# Patient Record
Sex: Male | Born: 1960 | Hispanic: Yes | Marital: Married | Smoking: Former smoker
Health system: Southern US, Community
[De-identification: ages and names within clinical notes are randomized; demographics above are authoritative.]

## PROBLEM LIST (undated history)

## (undated) DIAGNOSIS — I10 Essential (primary) hypertension: Secondary | ICD-10-CM

## (undated) DIAGNOSIS — K219 Gastro-esophageal reflux disease without esophagitis: Secondary | ICD-10-CM

---

## 2017-12-28 ENCOUNTER — Observation Stay (HOSPITAL_COMMUNITY)
Admission: EM | Admit: 2017-12-28 | Discharge: 2017-12-30 | Disposition: A | Discharge status: Home / Self Care | Payer: Self-pay | Attending: Internal Medicine | Admitting: Internal Medicine

## 2017-12-28 ENCOUNTER — Emergency Department (HOSPITAL_COMMUNITY): Payer: Self-pay

## 2017-12-28 ENCOUNTER — Encounter (HOSPITAL_COMMUNITY): Payer: Self-pay | Admitting: Emergency Medicine

## 2017-12-28 DIAGNOSIS — R0600 Dyspnea, unspecified: Secondary | ICD-10-CM | POA: Insufficient documentation

## 2017-12-28 DIAGNOSIS — R110 Nausea: Secondary | ICD-10-CM

## 2017-12-28 DIAGNOSIS — I161 Hypertensive emergency: Secondary | ICD-10-CM | POA: Insufficient documentation

## 2017-12-28 DIAGNOSIS — R072 Precordial pain: Secondary | ICD-10-CM

## 2017-12-28 DIAGNOSIS — I10 Essential (primary) hypertension: Secondary | ICD-10-CM | POA: Insufficient documentation

## 2017-12-28 DIAGNOSIS — Z87891 Personal history of nicotine dependence: Secondary | ICD-10-CM | POA: Insufficient documentation

## 2017-12-28 DIAGNOSIS — R030 Elevated blood-pressure reading, without diagnosis of hypertension: Secondary | ICD-10-CM

## 2017-12-28 DIAGNOSIS — R079 Chest pain, unspecified: Principal | ICD-10-CM | POA: Insufficient documentation

## 2017-12-28 DIAGNOSIS — R066 Hiccough: Secondary | ICD-10-CM | POA: Insufficient documentation

## 2017-12-28 HISTORY — DX: Gastro-esophageal reflux disease without esophagitis: K21.9

## 2017-12-28 HISTORY — DX: Essential (primary) hypertension: I10

## 2017-12-28 LAB — COMPREHENSIVE METABOLIC PANEL
ALK PHOS: 64 U/L (ref 38–126)
ALT: 29 U/L (ref 0–44)
ANION GAP: 11 (ref 5–15)
AST: 47 U/L — ABNORMAL HIGH (ref 15–41)
Albumin: 4.3 g/dL (ref 3.5–5.0)
BILIRUBIN TOTAL: 1 mg/dL (ref 0.3–1.2)
BUN: 6 mg/dL (ref 6–20)
CALCIUM: 8.7 mg/dL — AB (ref 8.9–10.3)
CO2: 23 mmol/L (ref 22–32)
Chloride: 102 mmol/L (ref 98–111)
Creatinine, Ser: 0.71 mg/dL (ref 0.61–1.24)
Glucose, Bld: 115 mg/dL — ABNORMAL HIGH (ref 70–99)
Potassium: 3.5 mmol/L (ref 3.5–5.1)
SODIUM: 136 mmol/L (ref 135–145)
TOTAL PROTEIN: 7.9 g/dL (ref 6.5–8.1)

## 2017-12-28 LAB — URINALYSIS, ROUTINE W REFLEX MICROSCOPIC
Bilirubin Urine: NEGATIVE
GLUCOSE, UA: 50 mg/dL — AB
HGB URINE DIPSTICK: NEGATIVE
Ketones, ur: NEGATIVE mg/dL
Leukocytes, UA: NEGATIVE
Nitrite: NEGATIVE
PH: 6 (ref 5.0–8.0)
Protein, ur: NEGATIVE mg/dL
SPECIFIC GRAVITY, URINE: 1.005 (ref 1.005–1.030)

## 2017-12-28 LAB — CBC WITH DIFFERENTIAL/PLATELET
Basophils Absolute: 0.1 10*3/uL (ref 0.0–0.1)
Basophils Relative: 1 %
EOS ABS: 0 10*3/uL (ref 0.0–0.7)
Eosinophils Relative: 0 %
HCT: 38.9 % — ABNORMAL LOW (ref 39.0–52.0)
HEMOGLOBIN: 14 g/dL (ref 13.0–17.0)
LYMPHS ABS: 0.9 10*3/uL (ref 0.7–4.0)
Lymphocytes Relative: 13 %
MCH: 33.4 pg (ref 26.0–34.0)
MCHC: 36 g/dL (ref 30.0–36.0)
MCV: 92.8 fL (ref 78.0–100.0)
MONOS PCT: 7 %
Monocytes Absolute: 0.5 10*3/uL (ref 0.1–1.0)
NEUTROS PCT: 79 %
Neutro Abs: 5.4 10*3/uL (ref 1.7–7.7)
Platelets: 242 10*3/uL (ref 150–400)
RBC: 4.19 MIL/uL — ABNORMAL LOW (ref 4.22–5.81)
RDW: 13.9 % (ref 11.5–15.5)
WBC: 6.8 10*3/uL (ref 4.0–10.5)

## 2017-12-28 LAB — I-STAT TROPONIN, ED
TROPONIN I, POC: 0 ng/mL (ref 0.00–0.08)
Troponin i, poc: 0 ng/mL (ref 0.00–0.08)

## 2017-12-28 LAB — RAPID URINE DRUG SCREEN, HOSP PERFORMED
Amphetamines: NOT DETECTED
Benzodiazepines: NOT DETECTED
Cocaine: NOT DETECTED
Opiates: POSITIVE — AB
TETRAHYDROCANNABINOL: NOT DETECTED

## 2017-12-28 LAB — LIPASE, BLOOD: LIPASE: 34 U/L (ref 11–51)

## 2017-12-28 LAB — I-STAT CG4 LACTIC ACID, ED
LACTIC ACID, VENOUS: 3.43 mmol/L — AB (ref 0.5–1.9)
Lactic Acid, Venous: 1.61 mmol/L (ref 0.5–1.9)

## 2017-12-28 MED ORDER — LABETALOL HCL 5 MG/ML IV SOLN
10.0000 mg | Freq: Once | INTRAVENOUS | Status: AC
  Administered 2017-12-28: 10 mg via INTRAVENOUS
  Filled 2017-12-28: qty 4

## 2017-12-28 MED ORDER — IOPAMIDOL (ISOVUE-370) INJECTION 76%
INTRAVENOUS | Status: AC
  Filled 2017-12-28: qty 100

## 2017-12-28 MED ORDER — MORPHINE SULFATE (PF) 4 MG/ML IV SOLN
4.0000 mg | Freq: Once | INTRAVENOUS | Status: AC
  Administered 2017-12-28: 4 mg via INTRAVENOUS
  Filled 2017-12-28: qty 1

## 2017-12-28 MED ORDER — IOPAMIDOL (ISOVUE-370) INJECTION 76%
100.0000 mL | Freq: Once | INTRAVENOUS | Status: AC | PRN
  Administered 2017-12-28: 100 mL via INTRAVENOUS

## 2017-12-28 MED ORDER — ONDANSETRON HCL 4 MG/2ML IJ SOLN
4.0000 mg | Freq: Once | INTRAMUSCULAR | Status: AC
  Administered 2017-12-28: 4 mg via INTRAVENOUS
  Filled 2017-12-28: qty 2

## 2017-12-28 NOTE — ED Provider Notes (Signed)
[facility]-EMERGENCY DEPT Provider Note   CSN: [id number] Arrival date & time: 12/28/17  1546     History   Chief Complaint Chief Complaint  Patient presents with  . Hypertension  . Hiccups    HPI Christopher Boyd is a 57 y.o. male.  The history is provided by the patient and medical records. No language interpreter was used.  Chest Pain   This is a new problem. The current episode started yesterday. The problem occurs constantly. The problem has been gradually worsening. The pain is present in the substernal region. The pain is at a severity of 10/10. The pain is severe. The quality of the pain is described as heavy, pressure-like and sharp. The pain does not radiate. Associated symptoms include cough, nausea and shortness of breath. Pertinent negatives include no abdominal pain, no back pain, no diaphoresis, no fever, no numbness, no palpitations and no vomiting. He has tried nothing for the symptoms. The treatment provided no relief. Risk factors include male gender.    History reviewed. No pertinent past medical history.  There are no active problems to display for this patient.   History reviewed. No pertinent surgical history.      Home Medications    Prior to Admission medications   Not on File    Family History No family history on file.  Social History Social History   Tobacco Use  . Smoking status: Not on file  Substance Use Topics  . Alcohol use: Not on file  . Drug use: Not on file     Allergies   Patient has no known allergies.   Review of Systems Review of Systems  Constitutional: Positive for chills. Negative for diaphoresis, fatigue and fever.  HENT: Negative for congestion.   Eyes: Negative for visual disturbance.  Respiratory: Positive for cough and shortness of breath. Negative for choking, chest tightness and wheezing.   Cardiovascular: Positive for chest pain. Negative for palpitations.  Gastrointestinal:  Positive for nausea. Negative for abdominal distention, abdominal pain, constipation, diarrhea and vomiting.  Genitourinary: Negative for flank pain and frequency.  Musculoskeletal: Negative for back pain, neck pain and neck stiffness.  Skin: Negative for rash and wound.  Neurological: Negative for light-headedness and numbness.  Psychiatric/Behavioral: Negative for agitation and confusion.  All other systems reviewed and are negative.    Physical Exam Updated Vital Signs BP (!) 191/115 (BP Location: Right Arm)   Pulse 94   Temp 98 F (36.7 C) (Oral)   Resp (!) 21   SpO2 99%   Physical Exam  Constitutional: He is oriented to person, place, and time. He appears well-developed and well-nourished. No distress.  HENT:  Head: Normocephalic.  Nose: Nose normal.  Mouth/Throat: Oropharynx is clear and moist. No oropharyngeal exudate.  Eyes: Pupils are equal, round, and reactive to light. Conjunctivae and EOM are normal.  Neck: Normal range of motion. Neck supple.  Cardiovascular: Intact distal pulses. Tachycardia present.  No murmur heard. Pulmonary/Chest: No stridor. Tachypnea noted. No respiratory distress. He has no wheezes. He has no rales. He exhibits no tenderness.  Abdominal: Soft. Normal appearance and bowel sounds are normal. He exhibits no distension. There is tenderness in the epigastric area and periumbilical area.    Musculoskeletal: He exhibits no edema or tenderness.  Neurological: He is alert and oriented to person, place, and time. No sensory deficit. He exhibits normal muscle tone.  Skin: Capillary refill takes less than 2 seconds. He is not diaphoretic. No erythema. No pallor.  Nursing note and vitals reviewed.    ED Treatments / Results  Labs (all labs ordered are listed, but only abnormal results are displayed) Labs Reviewed  CBC WITH DIFFERENTIAL/PLATELET - Abnormal; Notable for the following components:      Result Value   RBC 4.19 (*)    HCT 38.9 (*)     All other components within normal limits  COMPREHENSIVE METABOLIC PANEL - Abnormal; Notable for the following components:   Glucose, Bld 115 (*)    Calcium 8.7 (*)    AST 47 (*)    All other components within normal limits  URINALYSIS, ROUTINE W REFLEX MICROSCOPIC - Abnormal; Notable for the following components:   Color, Urine STRAW (*)    Glucose, UA 50 (*)    All other components within normal limits  RAPID URINE DRUG SCREEN, HOSP PERFORMED - Abnormal; Notable for the following components:   Opiates POSITIVE (*)    Barbiturates   (*)    Value: Result not available. Reagent lot number recalled by manufacturer.   All other components within normal limits  I-STAT CG4 LACTIC ACID, ED - Abnormal; Notable for the following components:   Lactic Acid, Venous 3.43 (*)    All other components within normal limits  LIPASE, BLOOD  HIV ANTIBODY (ROUTINE TESTING)  COMPREHENSIVE METABOLIC PANEL  CBC  HEMOGLOBIN A1C  LIPID PANEL  TROPONIN I  TROPONIN I  TROPONIN I  HEPATITIS PANEL, ACUTE  I-STAT TROPONIN, ED  I-STAT CG4 LACTIC ACID, ED  I-STAT TROPONIN, ED    EKG EKG Interpretation  Date/Time:  Monday December 28 2017 15:59:25 EDT Ventricular Rate:  94 PR Interval:    QRS Duration: 103 QT Interval:  336 QTC Calculation: 421 R Axis:   -23 Text Interpretation:  Sinus rhythm Left ventricular hypertrophy No prior ECG for comparison.  No STEMI Confirmed by Theda Belfast ([id number]) on 12/28/2017 5:03:07 PM   Radiology Dg Chest 2 View  Result Date: 12/28/2017 CLINICAL DATA:  Shortness of breath. EXAM: CHEST - 2 VIEW COMPARISON:  None. FINDINGS: Mild cardiomegaly. Normal pulmonary vascularity. Mild patchy opacity in the right lower lobe. No focal consolidation, pleural effusion, or pneumothorax. No acute osseous abnormality. IMPRESSION: 1. Mild patchy opacities in the right lower lobe could reflect atelectasis or developing infiltrate. Electronically Signed   By: Obie Dredge M.D.   On:  12/28/2017 17:44   Ct Head Wo Contrast  Result Date: 12/28/2017 CLINICAL DATA:  Headache with elevated blood pressure and hiccups. EXAM: CT HEAD WITHOUT CONTRAST TECHNIQUE: Contiguous axial images were obtained from the base of the skull through the vertex without intravenous contrast. COMPARISON:  None. FINDINGS: Brain: The brainstem, cerebellum, cerebral peduncles, thalami, basal ganglia, basilar cisterns, and ventricular system appear within normal limits. No intracranial hemorrhage, mass lesion, or acute CVA. Vascular: Unremarkable Skull: Unremarkable Sinuses/Orbits: Suspected old left medial orbital wall fracture. Mild chronic ethmoid sinusitis. Other: No supplemental non-categorized findings. IMPRESSION: 1. No significant intracranial abnormality to account for the patient's headache. 2. Mild chronic ethmoid sinusitis. Electronically Signed   By: Gaylyn Rong M.D.   On: 12/28/2017 19:53   Ct Angio Chest/abd/pel For Dissection W And/or Wo Contrast  Result Date: 12/28/2017 CLINICAL DATA:  Acute 10/10 chest and abdominal pain. Shortness of breath. Hiccuping. Hypertensive. EXAM: CT ANGIOGRAPHY CHEST, ABDOMEN AND PELVIS TECHNIQUE: Multidetector CT imaging through the chest, abdomen and pelvis was performed using the standard protocol during bolus administration of intravenous contrast. Multiplanar reconstructed images and MIPs were obtained and reviewed to  evaluate the vascular anatomy. CONTRAST:  ISOVUE-370 IOPAMIDOL (ISOVUE-370) INJECTION 76% COMPARISON:  None. FINDINGS: CTA CHEST FINDINGS CARDIOVASCULAR: Thoracic aorta is normal course and caliber. Two vessel arch is a normal variant. No intrinsic density on noncontrast CT. Homogeneous contrast opacification of thoracic aorta without dissection, aneurysm, luminal irregularity, periaortic fluid collections, or contrast extravasation. Heart size is mildly enlarged. No pericardial effusion. MEDIASTINUM/NODES: No mediastinal mass or  lymphadenopathy by CT size criteria. LUNGS/PLEURA: Tracheobronchial tree is patent, no pneumothorax. No pleural effusions, focal consolidations, pulmonary nodules or masses. Minimal dependent atelectasis. MUSCULOSKELETAL: Non-suspicious. Review of the MIP images confirms the above findings. CTA ABDOMEN AND PELVIS FINDINGS VASCULAR Aorta: Abdominal aorta is normal course and caliber. Homogeneous contrast opacification of aortoiliac vessels without dissection, aneurysm, luminal irregularity, periaortic fluid collections, or contrast extravasation. Mild calcific atherosclerosis. Celiac: Patent. SMA: Patent. Renals: Patent. IMA: Patent. Inflow: Negative. Veins: Negative, not tailored for evaluation. Review of the MIP images confirms the above findings. NON-VASCULAR HEPATOBILIARY: Liver and gallbladder are normal. PANCREAS: Normal. SPLEEN: Normal. ADRENALS/URINARY TRACT: Kidneys are orthotopic, demonstrating symmetric enhancement. No nephrolithiasis, hydronephrosis or solid renal masses. The unopacified ureters are normal in course and caliber. Urinary bladder is adequately distended and unremarkable. Normal adrenal glands. STOMACH/BOWEL: Very small hiatal hernia. The stomach, small and large bowel are normal in course and caliber without inflammatory changes. Mild sigmoid colonic diverticulosis. Normal appendix. VASCULAR/LYMPHATIC: No lymphadenopathy by CT size criteria. REPRODUCTIVE: Normal. OTHER: No intraperitoneal free fluid or free air. Small fat containing LEFT inguinal hernia. MUSCULOSKELETAL: Nonacute. Dependent subcutaneous edema. From severe L5-S1 disc height loss with vacuum disc consistent with degenerative discs, moderate at L3-4 and L4-5. Severe L5-S1 moderate to severe L4-5 neural foraminal narrowing. Osteopenia. Review of the MIP images confirms the above findings. IMPRESSION: CTA CHEST: 1. No acute vascular process. 2. Mild cardiomegaly.  No acute pulmonary process. CTA ABDOMEN AND PELVIS: 1. No acute  vascular process or acute intra-abdominal/pelvic disease. 2. Osteopenia. Aortic Atherosclerosis (ICD10-I70.0). Electronically Signed   By: Awilda Metro M.D.   On: 12/28/2017 19:58    Procedures Procedures (including critical care time)  CRITICAL CARE Performed by: Canary Brim Tegeler Total critical care time: 35 minutes Critical care time was exclusive of separately billable procedures and treating other patients. Hypertensive emergency with multiple doses of IV blood pressure medication requiring admission. Critical care was necessary to treat or prevent imminent or life-threatening deterioration. Critical care was time spent personally by me on the following activities: development of treatment plan with patient and/or surrogate as well as nursing, discussions with consultants, evaluation of patient's response to treatment, examination of patient, obtaining history from patient or surrogate, ordering and performing treatments and interventions, ordering and review of laboratory studies, ordering and review of radiographic studies, pulse oximetry and re-evaluation of patient's condition.   Medications Ordered in ED Medications  iopamidol (ISOVUE-370) 76 % injection (has no administration in time range)  enoxaparin (LOVENOX) injection 40 mg (has no administration in time range)  sodium chloride flush (NS) 0.9 % injection 3 mL (has no administration in time range)  sodium chloride flush (NS) 0.9 % injection 3 mL (has no administration in time range)  0.9 %  sodium chloride infusion (has no administration in time range)  acetaminophen (TYLENOL) tablet 650 mg (has no administration in time range)    Or  acetaminophen (TYLENOL) suppository 650 mg (has no administration in time range)  nitroGLYCERIN (NITROGLYN) 2 % ointment 1 inch (has no administration in time range)  atorvastatin (LIPITOR) tablet 80  mg (has no administration in time range)  losartan (COZAAR) tablet 25 mg (has no  administration in time range)  hydrALAZINE (APRESOLINE) injection 10 mg (has no administration in time range)  carvedilol (COREG) tablet 3.125 mg (has no administration in time range)  aspirin EC tablet 325 mg (has no administration in time range)  morphine 4 MG/ML injection 4 mg (4 mg Intravenous Given 12/28/17 1757)  ondansetron (ZOFRAN) injection 4 mg (4 mg Intravenous Given 12/28/17 1756)  labetalol (NORMODYNE,TRANDATE) injection 10 mg (10 mg Intravenous Given 12/28/17 1758)  iopamidol (ISOVUE-370) 76 % injection 100 mL (100 mLs Intravenous Contrast Given 12/28/17 1927)  labetalol (NORMODYNE,TRANDATE) injection 10 mg (10 mg Intravenous Given 12/28/17 2353)     Initial Impression / Assessment and Plan / ED Course  I have reviewed the triage vital signs and the nursing notes.  Pertinent labs & imaging results that were available during my care of the patient were reviewed by me and considered in my medical decision making (see chart for details).     Christopher Boyd is a 57 y.o. male with no reported past medical history who presents for chest pain, abdominal pain, shortness of breath, and hiccuping.  Interpreter services were used for all conversations.  Patient reports that yesterday while at work on break he started having sudden onset of pain.  He describes it as a pressure and tightness in his central chest radiating into his abdomen and into his back.  He says that his blood pressure is high but he  has never been on medications for them.  He denies any family history of cardiac disease and does not smoke.  He reports the pain has been a 10 out of 10.  He also reports severe hiccups.  He denies recent trauma but does say back in May he fell off a scooter that denied subsequent injuries.  He reports nausea but no vomiting.  He does report diaphoresis with it.  He reports shortness of breath and chest tightness.  He denies any syncope urinary symptoms or other GI symptoms.  He reports a mild  cough.  On arrival, patient was found to be tachypneic and hypertensive with a blood pressure in the 190s systolic.  Patient appears uncomfortable and is hiccuping and grimacing in chest and abdominal pain.  Patient's abdomen was tender to palpation in the central abdomen.  Lower chest was mildly tender to palpation.  Lungs were clear.  No murmur was appreciated.  No wheezing was appreciated.  Patient has symmetric pulses in upper and lower extremity's.  No sensation or strength abnormalities in any extremity.  Patient had no facial droop and otherwise had no significant neurologic deficit seen.  Clinically I am concerned patient having the pain in the chest abdomen going to his back about possible dissection given the patient's untreated hypertension.  Will be treated with both pain medicine, nausea medicine, and labetalol for blood pressure.  Patient will screen laboratory testing and a dissection study will be ordered.  If patient' work-up was otherwise negative, will consider medication like Thorazine for the hiccups but I feel the patient's chest pain abdominal pain may be related to hypertensive emergency with his elevated blood pressure.  Patient does report on reassessment that he is having some headaches.  He continues to deny other neurological deficits however with his blood pressure elevated, will obtain head CT before other CTs to rule out hemorrhage as intracranial hemorrhage can cause hiccups.  11:12 PM Patient's diagnostic work-up results are seen  above.  Lactic acid initially elevated however it improved with some fluids.  Patient's troponin was negative x2.  Urinalysis did not show infection.  CBC reassuring and metabolic panel shows slight hypocalcemia.  Lipase not elevated.  CT dissection study did not show acute abnormalities in the chest, abdomen, or pelvis.  Specifically, there did not show the infiltrate seen on chest x-ray.  Clinically with only mild cough and no fever seen, I  do not think he has a pneumonia after the reassuring CT.  I am more concerned about hypertensive emergency as his blood pressure jumped back up into the 190s over 120s after labetalol.  Without any history of hypertension I am concerned about managing this in the setting of his pressure-like chest pain.  Patient's heart score was calculated as a 5 with a left bundle, his description of symptoms and the likely hypertension.  With the patient's high risk chest pain and the uncontrolled hypertension with IV labetalol, I am concerned about patient safety going home.    Patient was agreeable to admission for further management.  Hospitalist will be called for admission.  Hospitalist requested I speak with cardiology was called.  Cardiology agreed with admission at this facility and will see him in the morning.   Final Clinical Impressions(s) / ED Diagnoses   Final diagnoses:  Precordial pain  Hiccups  Elevated BP without diagnosis of hypertension  Nausea    ED Discharge Orders    None      Clinical Impression: 1. Precordial pain   2. Hiccups   3. Elevated BP without diagnosis of hypertension   4. Nausea     Disposition: Admit  This note was prepared with assistance of Dragon voice recognition software. Occasional wrong-word or sound-a-like substitutions may have occurred due to the inherent limitations of voice recognition software.     Tegeler, Canary Brimhristopher J, MD 12/29/17 Moses [id number]

## 2017-12-28 NOTE — ED Notes (Signed)
Patient transported to X-ray 

## 2017-12-28 NOTE — ED Triage Notes (Signed)
Per interpreter, patient c/o hypertension and hiccups since yesterday. Denies having BP medications. Reports hiccups are causing SOB. Denies chest pain. Patient actively hiccupping in triage.

## 2017-12-29 ENCOUNTER — Other Ambulatory Visit: Payer: Self-pay

## 2017-12-29 ENCOUNTER — Observation Stay (HOSPITAL_BASED_OUTPATIENT_CLINIC_OR_DEPARTMENT_OTHER): Payer: Self-pay

## 2017-12-29 ENCOUNTER — Encounter (HOSPITAL_COMMUNITY): Payer: Self-pay | Admitting: Cardiology

## 2017-12-29 DIAGNOSIS — R079 Chest pain, unspecified: Secondary | ICD-10-CM

## 2017-12-29 DIAGNOSIS — I10 Essential (primary) hypertension: Secondary | ICD-10-CM

## 2017-12-29 LAB — COMPREHENSIVE METABOLIC PANEL
ALT: 23 U/L (ref 0–44)
AST: 40 U/L (ref 15–41)
Albumin: 3.6 g/dL (ref 3.5–5.0)
Alkaline Phosphatase: 64 U/L (ref 38–126)
Anion gap: 10 (ref 5–15)
BUN: 7 mg/dL (ref 6–20)
CHLORIDE: 101 mmol/L (ref 98–111)
CO2: 29 mmol/L (ref 22–32)
CREATININE: 0.72 mg/dL (ref 0.61–1.24)
Calcium: 8.6 mg/dL — ABNORMAL LOW (ref 8.9–10.3)
Glucose, Bld: 111 mg/dL — ABNORMAL HIGH (ref 70–99)
Potassium: 3.5 mmol/L (ref 3.5–5.1)
Sodium: 140 mmol/L (ref 135–145)
Total Bilirubin: 1.9 mg/dL — ABNORMAL HIGH (ref 0.3–1.2)
Total Protein: 7.1 g/dL (ref 6.5–8.1)

## 2017-12-29 LAB — LIPID PANEL
Cholesterol: 186 mg/dL (ref 0–200)
HDL: 78 mg/dL (ref >40)
LDL CALC: 80 mg/dL (ref 0–99)
Total CHOL/HDL Ratio: 2.4 RATIO
Triglycerides: 142 mg/dL (ref <150)
VLDL: 28 mg/dL (ref 0–40)

## 2017-12-29 LAB — TROPONIN I: Troponin I: <0.03 ng/mL (ref <0.03)

## 2017-12-29 LAB — CBC
HCT: 37.5 % — ABNORMAL LOW (ref 39.0–52.0)
Hemoglobin: 13.4 g/dL (ref 13.0–17.0)
MCH: 33.3 pg (ref 26.0–34.0)
MCHC: 35.7 g/dL (ref 30.0–36.0)
MCV: 93.1 fL (ref 78.0–100.0)
PLATELETS: 215 10*3/uL (ref 150–400)
RBC: 4.03 MIL/uL — ABNORMAL LOW (ref 4.22–5.81)
RDW: 14 % (ref 11.5–15.5)
WBC: 6.1 10*3/uL (ref 4.0–10.5)

## 2017-12-29 LAB — ECHOCARDIOGRAM COMPLETE

## 2017-12-29 LAB — HIV ANTIBODY (ROUTINE TESTING W REFLEX): HIV SCREEN 4TH GENERATION: NONREACTIVE

## 2017-12-29 MED ORDER — CARVEDILOL 3.125 MG PO TABS
3.1250 mg | ORAL_TABLET | Freq: Two times a day (BID) | ORAL | Status: DC
  Administered 2017-12-29: 3.125 mg via ORAL
  Filled 2017-12-29: qty 1

## 2017-12-29 MED ORDER — ASPIRIN EC 325 MG PO TBEC
325.0000 mg | DELAYED_RELEASE_TABLET | Freq: Every day | ORAL | Status: DC
  Administered 2017-12-29 – 2017-12-30 (×2): 325 mg via ORAL
  Filled 2017-12-29 (×2): qty 1

## 2017-12-29 MED ORDER — FOLIC ACID 1 MG PO TABS
1.0000 mg | ORAL_TABLET | Freq: Every day | ORAL | Status: DC
  Administered 2017-12-29 – 2017-12-30 (×2): 1 mg via ORAL
  Filled 2017-12-29 (×2): qty 1

## 2017-12-29 MED ORDER — CARVEDILOL 6.25 MG PO TABS
6.2500 mg | ORAL_TABLET | Freq: Two times a day (BID) | ORAL | Status: DC
  Administered 2017-12-29 – 2017-12-30 (×2): 6.25 mg via ORAL
  Filled 2017-12-29 (×2): qty 1

## 2017-12-29 MED ORDER — LORAZEPAM 2 MG/ML IJ SOLN: 0.0000 mg | Freq: Four times a day (QID) | INTRAMUSCULAR | Status: DC

## 2017-12-29 MED ORDER — ATORVASTATIN CALCIUM 40 MG PO TABS
80.0000 mg | ORAL_TABLET | Freq: Every day | ORAL | Status: DC
  Administered 2017-12-29: 80 mg via ORAL
  Filled 2017-12-29: qty 1
  Filled 2017-12-29: qty 2

## 2017-12-29 MED ORDER — LOSARTAN POTASSIUM 25 MG PO TABS
25.0000 mg | ORAL_TABLET | Freq: Every day | ORAL | Status: DC
  Filled 2017-12-29: qty 1

## 2017-12-29 MED ORDER — VITAMIN B-1 100 MG PO TABS
100.0000 mg | ORAL_TABLET | Freq: Every day | ORAL | Status: DC
  Administered 2017-12-29 – 2017-12-30 (×2): 100 mg via ORAL
  Filled 2017-12-29 (×2): qty 1

## 2017-12-29 MED ORDER — LORAZEPAM 2 MG/ML IJ SOLN: 0.0000 mg | Freq: Two times a day (BID) | INTRAMUSCULAR | Status: DC

## 2017-12-29 MED ORDER — NITROGLYCERIN 2 % TD OINT
1.0000 [in_us] | TOPICAL_OINTMENT | Freq: Three times a day (TID) | TRANSDERMAL | Status: DC
  Administered 2017-12-29 – 2017-12-30 (×3): 1 [in_us] via TOPICAL
  Filled 2017-12-29: qty 30

## 2017-12-29 MED ORDER — SODIUM CHLORIDE 0.9% FLUSH
3.0000 mL | Freq: Two times a day (BID) | INTRAVENOUS | Status: DC
  Administered 2017-12-29 – 2017-12-30 (×4): 3 mL via INTRAVENOUS

## 2017-12-29 MED ORDER — LORAZEPAM 2 MG/ML IJ SOLN: 1.0000 mg | Freq: Four times a day (QID) | INTRAMUSCULAR | Status: DC | PRN

## 2017-12-29 MED ORDER — LOSARTAN POTASSIUM 50 MG PO TABS
50.0000 mg | ORAL_TABLET | Freq: Every day | ORAL | Status: DC
  Administered 2017-12-29 – 2017-12-30 (×2): 50 mg via ORAL
  Filled 2017-12-29: qty 1

## 2017-12-29 MED ORDER — ADULT MULTIVITAMIN W/MINERALS CH
1.0000 | ORAL_TABLET | Freq: Every day | ORAL | Status: DC
  Administered 2017-12-29 – 2017-12-30 (×2): 1 via ORAL
  Filled 2017-12-29 (×2): qty 1

## 2017-12-29 MED ORDER — ENOXAPARIN SODIUM 40 MG/0.4ML ~~LOC~~ SOLN
40.0000 mg | SUBCUTANEOUS | Status: DC
  Administered 2017-12-29 – 2017-12-30 (×2): 40 mg via SUBCUTANEOUS
  Filled 2017-12-29 (×3): qty 0.4

## 2017-12-29 MED ORDER — HYDRALAZINE HCL 20 MG/ML IJ SOLN
10.0000 mg | Freq: Four times a day (QID) | INTRAMUSCULAR | Status: DC | PRN
  Filled 2017-12-29: qty 0.5

## 2017-12-29 MED ORDER — LORAZEPAM 1 MG PO TABS: 1.0000 mg | ORAL_TABLET | Freq: Four times a day (QID) | ORAL | Status: DC | PRN

## 2017-12-29 MED ORDER — ACETAMINOPHEN 650 MG RE SUPP: 650.0000 mg | Freq: Four times a day (QID) | RECTAL | Status: DC | PRN

## 2017-12-29 MED ORDER — SODIUM CHLORIDE 0.9 % IV SOLN: 250.0000 mL | INTRAVENOUS | Status: DC | PRN

## 2017-12-29 MED ORDER — SODIUM CHLORIDE 0.9% FLUSH: 3.0000 mL | INTRAVENOUS | Status: DC | PRN

## 2017-12-29 MED ORDER — ACETAMINOPHEN 325 MG PO TABS: 650.0000 mg | ORAL_TABLET | Freq: Four times a day (QID) | ORAL | Status: DC | PRN

## 2017-12-29 MED ORDER — THIAMINE HCL 100 MG/ML IJ SOLN: 100.0000 mg | Freq: Every day | INTRAMUSCULAR | Status: DC

## 2017-12-29 MED ORDER — CHLORPROMAZINE HCL 25 MG/ML IJ SOLN
25.0000 mg | Freq: Once | INTRAMUSCULAR | Status: AC
  Administered 2017-12-29: 25 mg via INTRAMUSCULAR
  Filled 2017-12-29: qty 1

## 2017-12-29 NOTE — H&P (Signed)
TRH H&P   Patient Demographics:    Christopher Boyd, is a 57 y.o. male  MRN: [id number]   DOB - May 29, 1961  Admit Date - 12/28/2017  Outpatient Primary MD for the patient is Patient, No Pcp Per  Referring MD/NP/PA:  Chriss Czar  Outpatient Specialists:    Patient coming from: home  Chief Complaint  Patient presents with  . Hypertension  . Hiccups      HPI:    Christopher Boyd  is a 57 y.o. male, w h/o hypertension felt sob while at work and had hiccups. pt states that has sharp left sided chest pain with hiccups.  Pt is currently cp free.  Pt denies, cough, fever, chills, n/v, diarrhea, brbpr, dysuria, hematuria.    In ED,  ekg nsr at 95 , nl axis, t flat/ inverted in v5,6.   CTA chest IMPRESSION: CTA CHEST:  1. No acute vascular process. 2. Mild cardiomegaly.  No acute pulmonary process.  CTA ABDOMEN AND PELVIS:  1. No acute vascular process or acute intra-abdominal/pelvic disease. 2. Osteopenia.  Wbc 6.8, Hgb 14.0, Plt 242 Na 136, K 3.5,  Bun 6, Creatinine 0.71 Ast 47, Alt 29 Al phos 64, T, bili 1.0  Trop 0.00  Pt will be admitted for w/up of chest pain, dyspnea.  And hypertensive emergency   Review of systems:    In addition to the HPI above,  No Fever-chills, No Headache, No changes with Vision or hearing, No problems swallowing food or Liquids, No  Cough No Abdominal pain, No Nausea or Vommitting, Bowel movements are regular, No Blood in stool or Urine, No dysuria, No new skin rashes or bruises, No new joints pains-aches,  No new weakness, tingling, numbness in any extremity, No recent weight gain or loss, No polyuria, polydypsia or polyphagia, No significant Mental Stressors.  A full 10 point Review of Systems was done, except as stated above, all other Review of Systems were negative.   With Past History of the  following :    Past Medical History:  Diagnosis Date  . GERD (gastroesophageal reflux disease)   . Hypertension       History reviewed. No pertinent surgical history.    Social History:     Social History   Tobacco Use  . Smoking status: Former Smoker    Packs/day: 0.50    Years: 5.00    Pack years: 2.50    Types: Cigarettes  . Smokeless tobacco: Never Used  Substance Use Topics  . Alcohol use: Yes    Alcohol/week: 4.8 oz    Types: 8 Cans of beer per week     Lives - at home   Mobility - walks by self   Family History :     Family History  Problem Relation Age of Onset  . Hyperlipidemia Mother       Home Medications:   Prior  to Admission medications   Not on File     Allergies:    No Known Allergies   Physical Exam:   Vitals  Blood pressure (!) 177/99, pulse 68, temperature 98 F (36.7 C), temperature source Oral, resp. rate (!) 24, SpO2 100 %.   1. General  lying in bed in NAD,  2. Normal affect and insight, Not Suicidal or Homicidal, Awake Alert, Oriented X 3.  3. No F.N deficits, ALL C.Nerves Intact, Strength 5/5 all 4 extremities, Sensation intact all 4 extremities, Plantars down going.  4. Ears and Eyes appear Normal, Conjunctivae clear, PERRLA. Moist Oral Mucosa.  5. Supple Neck, No JVD, No cervical lymphadenopathy appriciated, No Carotid Bruits.  6. Symmetrical Chest wall movement, Good air movement bilaterally, CTAB.  7. RRR, No Gallops, Rubs or Murmurs, No Parasternal Heave.  8. Positive Bowel Sounds, Abdomen Soft, No tenderness, No organomegaly appriciated,No rebound -guarding or rigidity.  9.  No Cyanosis, Normal Skin Turgor, No Skin Rash or Bruise.  10. Good muscle tone,  joints appear normal , no effusions, Normal ROM.  11. No Palpable Lymph Nodes in Neck or Axillae     Data Review:    CBC Recent Labs  Lab 12/28/17 1753  WBC 6.8  HGB 14.0  HCT 38.9*  PLT 242  MCV 92.8  MCH 33.4  MCHC 36.0  RDW 13.9    LYMPHSABS 0.9  MONOABS 0.5  EOSABS 0.0  BASOSABS 0.1   ------------------------------------------------------------------------------------------------------------------  Chemistries  Recent Labs  Lab 12/28/17 1753  NA 136  K 3.5  CL 102  CO2 23  GLUCOSE 115*  BUN 6  CREATININE 0.71  CALCIUM 8.7*  AST 47*  ALT 29  ALKPHOS 64  BILITOT 1.0   ------------------------------------------------------------------------------------------------------------------ CrCl cannot be calculated (Unknown ideal weight.). ------------------------------------------------------------------------------------------------------------------ No results for input(s): TSH, T4TOTAL, T3FREE, THYROIDAB in the last 72 hours.  Invalid input(s): FREET3  Coagulation profile No results for input(s): INR, PROTIME in the last 168 hours. ------------------------------------------------------------------------------------------------------------------- No results for input(s): DDIMER in the last 72 hours. -------------------------------------------------------------------------------------------------------------------  Cardiac Enzymes No results for input(s): CKMB, TROPONINI, MYOGLOBIN in the last 168 hours.  Invalid input(s): CK ------------------------------------------------------------------------------------------------------------------ No results found for: BNP   ---------------------------------------------------------------------------------------------------------------  Urinalysis    Component Value Date/Time   COLORURINE STRAW (A) 12/28/2017 1856   APPEARANCEUR CLEAR 12/28/2017 1856   LABSPEC 1.005 12/28/2017 1856   PHURINE 6.0 12/28/2017 1856   GLUCOSEU 50 (A) 12/28/2017 1856   HGBUR NEGATIVE 12/28/2017 1856   BILIRUBINUR NEGATIVE 12/28/2017 1856   KETONESUR NEGATIVE 12/28/2017 1856   PROTEINUR NEGATIVE 12/28/2017 1856   NITRITE NEGATIVE 12/28/2017 1856   LEUKOCYTESUR NEGATIVE  12/28/2017 1856    ----------------------------------------------------------------------------------------------------------------   Imaging Results:    Dg Chest 2 View  Result Date: 12/28/2017 CLINICAL DATA:  Shortness of breath. EXAM: CHEST - 2 VIEW COMPARISON:  None. FINDINGS: Mild cardiomegaly. Normal pulmonary vascularity. Mild patchy opacity in the right lower lobe. No focal consolidation, pleural effusion, or pneumothorax. No acute osseous abnormality. IMPRESSION: 1. Mild patchy opacities in the right lower lobe could reflect atelectasis or developing infiltrate. Electronically Signed   By: Obie DredgeWilliam T Derry M.D.   On: 12/28/2017 17:44   Ct Head Wo Contrast  Result Date: 12/28/2017 CLINICAL DATA:  Headache with elevated blood pressure and hiccups. EXAM: CT HEAD WITHOUT CONTRAST TECHNIQUE: Contiguous axial images were obtained from the base of the skull through the vertex without intravenous contrast. COMPARISON:  None. FINDINGS: Brain: The brainstem, cerebellum, cerebral peduncles, thalami, basal ganglia,  basilar cisterns, and ventricular system appear within normal limits. No intracranial hemorrhage, mass lesion, or acute CVA. Vascular: Unremarkable Skull: Unremarkable Sinuses/Orbits: Suspected old left medial orbital wall fracture. Mild chronic ethmoid sinusitis. Other: No supplemental non-categorized findings. IMPRESSION: 1. No significant intracranial abnormality to account for the patient's headache. 2. Mild chronic ethmoid sinusitis. Electronically Signed   By: Gaylyn Rong M.D.   On: 12/28/2017 19:53   Ct Angio Chest/abd/pel For Dissection W And/or Wo Contrast  Result Date: 12/28/2017 CLINICAL DATA:  Acute 10/10 chest and abdominal pain. Shortness of breath. Hiccuping. Hypertensive. EXAM: CT ANGIOGRAPHY CHEST, ABDOMEN AND PELVIS TECHNIQUE: Multidetector CT imaging through the chest, abdomen and pelvis was performed using the standard protocol during bolus administration of  intravenous contrast. Multiplanar reconstructed images and MIPs were obtained and reviewed to evaluate the vascular anatomy. CONTRAST:  ISOVUE-370 IOPAMIDOL (ISOVUE-370) INJECTION 76% COMPARISON:  None. FINDINGS: CTA CHEST FINDINGS CARDIOVASCULAR: Thoracic aorta is normal course and caliber. Two vessel arch is a normal variant. No intrinsic density on noncontrast CT. Homogeneous contrast opacification of thoracic aorta without dissection, aneurysm, luminal irregularity, periaortic fluid collections, or contrast extravasation. Heart size is mildly enlarged. No pericardial effusion. MEDIASTINUM/NODES: No mediastinal mass or lymphadenopathy by CT size criteria. LUNGS/PLEURA: Tracheobronchial tree is patent, no pneumothorax. No pleural effusions, focal consolidations, pulmonary nodules or masses. Minimal dependent atelectasis. MUSCULOSKELETAL: Non-suspicious. Review of the MIP images confirms the above findings. CTA ABDOMEN AND PELVIS FINDINGS VASCULAR Aorta: Abdominal aorta is normal course and caliber. Homogeneous contrast opacification of aortoiliac vessels without dissection, aneurysm, luminal irregularity, periaortic fluid collections, or contrast extravasation. Mild calcific atherosclerosis. Celiac: Patent. SMA: Patent. Renals: Patent. IMA: Patent. Inflow: Negative. Veins: Negative, not tailored for evaluation. Review of the MIP images confirms the above findings. NON-VASCULAR HEPATOBILIARY: Liver and gallbladder are normal. PANCREAS: Normal. SPLEEN: Normal. ADRENALS/URINARY TRACT: Kidneys are orthotopic, demonstrating symmetric enhancement. No nephrolithiasis, hydronephrosis or solid renal masses. The unopacified ureters are normal in course and caliber. Urinary bladder is adequately distended and unremarkable. Normal adrenal glands. STOMACH/BOWEL: Very small hiatal hernia. The stomach, small and large bowel are normal in course and caliber without inflammatory changes. Mild sigmoid colonic diverticulosis.  Normal appendix. VASCULAR/LYMPHATIC: No lymphadenopathy by CT size criteria. REPRODUCTIVE: Normal. OTHER: No intraperitoneal free fluid or free air. Small fat containing LEFT inguinal hernia. MUSCULOSKELETAL: Nonacute. Dependent subcutaneous edema. From severe L5-S1 disc height loss with vacuum disc consistent with degenerative discs, moderate at L3-4 and L4-5. Severe L5-S1 moderate to severe L4-5 neural foraminal narrowing. Osteopenia. Review of the MIP images confirms the above findings. IMPRESSION: CTA CHEST: 1. No acute vascular process. 2. Mild cardiomegaly.  No acute pulmonary process. CTA ABDOMEN AND PELVIS: 1. No acute vascular process or acute intra-abdominal/pelvic disease. 2. Osteopenia. Aortic Atherosclerosis (ICD10-I70.0). Electronically Signed   By: Awilda Metro M.D.   On: 12/28/2017 19:58     Assessment & Plan:    Active Problems:   * No active hospital problems. *    Chest pain Tele  Trop I q6h x3 Check hga1c, lipid in am Check cardiac echo Aspirin 325mg  po qday lipitor 80mg  po qhs Start carvedilol 3.125mg  po bid NTP 1 inch topically q8h Cardiology consulted by ED, appreciate input  Hypertensive emergency Carvedilol as above NTP as above Start Amlodipine 5mg  po qday Start Hydralazine 10mg  iv q6h prn sbp >160  Dyspnea likely due to hypertension Check echo   Etoh use CIWA   DVT Prophylaxis Lovenox - SCDs  AM Labs Ordered, also please review Full  Orders  Family Communication: Admission, patients condition and plan of care including tests being ordered have been discussed with the patient  who indicate understanding and agree with the plan and Code Status.  Code Status FULL CODE  Likely DC to  home  Condition GUARDED    Consults called: cardiology by ED    Admission status: observation  Time spent in minutes : 60   Pearson Grippe M.D on 12/29/2017 at 12:05 AM  Between 7am to 7pm - Pager - [phone]  . After 7pm go to [email or web address] - password  [facility]  [facility] - Office  [phone]

## 2017-12-29 NOTE — Consult Note (Addendum)
Cardiology Consultation:   Patient ID: Christopher Boyd; [id number]; [birth year]   Admit date: 12/28/2017 Date of Consult: 12/29/2017  Primary Care Provider: Patient, No Pcp Per Primary Cardiologist: Dietrich Pates, MD new  Primary Electrophysiologist:  NA   Patient Profile:   Christopher Boyd is a 57 y.o. male with a hx of GERD, HTN and no medications who is being seen today for the evaluation of chest pain and uncontrolled HTN at the request of Dr. Butler Denmark.  History of Present Illness:   [name] a spanish speaking gentleman has a hx of GERD and HTN.  He is on no medication.  He presented to ER 12/28/17 with elevated BP and hiccups and SOB.  He also had pain in substernal region.  Heavy pressure like. No radiation.    BP was 191/115 on presentation.  He has been treated with IV labetalol with improvement in BP.  It was reported his pain was so severe he was in hall crying.  He tells me his pain was related to his hiccups, he knew his BP was elevated due to anxiety and the hiccups were worse.   3 years ago [place] and given meds but he had no refills.  No MD here.   He does drink 8-12 beers per day - he works outside with [employer] and uses beer to cool off.     EKG was SR with LVH, I personally reviewed.  No old EKGs to compare.     Troponins are all neg 0.00 to <0.03 X 4  Na 140, K+ 3.5, Cr 0.72, Lipase 34 , AST 47 now 40. Tchol 186, HDL 78, LDL 80 TG 142,  WBC 6.1 Hgb 13.4 plts 215.  CXR 2 V -- Mild patchy opacities in the right lower lobe could reflect atelectasis or developing infiltrate.  CTA of chest no vascular process, mild cardiomegaly no acute process, and abd no acute vascular process.   CT of head for headache without significant intracranial abnormality.   Today he has no pain and no hiccups, no SOB.  He has some epigastric discomfort he attributes to not eating.  I used computer interpreter for exam.  He does have occ irregular heart  beat..  Past Medical History:  Diagnosis Date  . GERD (gastroesophageal reflux disease)   . Hypertension     History reviewed. No pertinent surgical history.   Home Medications:  Prior to Admission medications   Not on File  NONE  Inpatient Medications: Scheduled Meds: . aspirin EC  325 mg Oral Daily  . atorvastatin  80 mg Oral q1800  . carvedilol  6.25 mg Oral BID WC  . enoxaparin (LOVENOX) injection  40 mg Subcutaneous Q24H  . folic acid  1 mg Oral Daily  . LORazepam  0-4 mg Intravenous Q6H   Followed by  . [START ON 12/31/2017] LORazepam  0-4 mg Intravenous Q12H  . losartan  50 mg Oral Daily  . multivitamin with minerals  1 tablet Oral Daily  . nitroGLYCERIN  1 inch Topical Q8H  . sodium chloride flush  3 mL Intravenous Q12H  . thiamine  100 mg Oral Daily   Or  . thiamine  100 mg Intravenous Daily   Continuous Infusions: . sodium chloride     PRN Meds: sodium chloride, acetaminophen **OR** acetaminophen, hydrALAZINE, LORazepam **OR** LORazepam, sodium chloride flush  Allergies:   No Known Allergies  Social History:   Social History   Socioeconomic History  . Marital  status: Married    Spouse name: Not on file  . Number of children: Not on file  . Years of education: Not on file  . Highest education level: Not on file  Occupational History  . Not on file  Social Needs  . Financial resource strain: Not on file  . Food insecurity:    Worry: Not on file    Inability: Not on file  . Transportation needs:    Medical: Not on file    Non-medical: Not on file  Tobacco Use  . Smoking status: Former Smoker    Packs/day: 0.50    Years: 5.00    Pack years: 2.50    Types: Cigarettes  . Smokeless tobacco: Never Used  Substance and Sexual Activity  . Alcohol use: Yes    Alcohol/week: 4.8 oz    Types: 8 Cans of beer per week  . Drug use: Not on file  . Sexual activity: Not on file  Lifestyle  . Physical activity:    Days per week: Not on file    Minutes  per session: Not on file  . Stress: Not on file  Relationships  . Social connections:    Talks on phone: Not on file    Gets together: Not on file    Attends religious service: Not on file    Active member of club or organization: Not on file    Attends meetings of clubs or organizations: Not on file    Relationship status: Not on file  . Intimate partner violence:    Fear of current or ex partner: Not on file    Emotionally abused: Not on file    Physically abused: Not on file    Forced sexual activity: Not on file  Other Topics Concern  . Not on file  Social History Narrative  . Not on file    Family History:    Family History  Problem Relation Age of Onset  . Hyperlipidemia Mother   . Heart disease Mother   . Healthy Sister      ROS:  Please see the history of present illness.  General:no colds or fevers, no weight changes Skin:no rashes or ulcers HEENT:no blurred vision, no congestion, after molar pulled has occ bleeding from site. mild CV:see HPI PUL:see HPI GI:no diarrhea constipation or melena, no indigestion GU:no hematuria, no dysuria MS:no joint pain, no claudication Neuro:no syncope, no lightheadedness Endo:no diabetes, no thyroid disease    All other ROS reviewed and negative.     Physical Exam/Data:   Vitals:   12/29/17 0924 12/29/17 0930 12/29/17 1008 12/29/17 1030  BP: (!) 169/101 (!) 153/94 (!) 155/102 (!) 161/96  Pulse: 60 (!) 59 (!) 57 65  Resp: 15 15 15 14   Temp:      TempSrc:      SpO2: 100% 100% 100% 100%   No intake or output data in the 24 hours ending 12/29/17 1212 There were no vitals filed for this visit. There is no height or weight on file to calculate BMI.  General:  Well nourished, well developed, in no acute distress, hungry  HEENT: normal Lymph: no adenopathy Neck: no JVD Endocrine:  No thryomegaly Vascular: No carotid bruits; pedal pulses 2+ bilaterally  Cardiac:  normal S1, S2; RRR; no murmur gallup rub or  click Lungs:  clear to auscultation bilaterally, no wheezing, rhonchi or rales  Abd: soft, nontender, no hepatomegaly  Ext: no edema Musculoskeletal:  No deformities, BUE and BLE strength normal  and equal Skin: warm and dry  Neuro:  Alert and oriented X 3 MAE follows commands, no focal abnormalities noted Psych:  Normal affect    Relevant CV Studies: Echo pending  Laboratory Data:  Chemistry Recent Labs  Lab 12/28/17 1753 12/29/17 0440  NA 136 140  K 3.5 3.5  CL 102 101  CO2 23 29  GLUCOSE 115* 111*  BUN 6 7  CREATININE 0.71 0.72  CALCIUM 8.7* 8.6*  GFRNONAA >60 >60  GFRAA >60 >60  ANIONGAP 11 10    Recent Labs  Lab 12/28/17 1753 12/29/17 0440  PROT 7.9 7.1  ALBUMIN 4.3 3.6  AST 47* 40  ALT 29 23  ALKPHOS 64 64  BILITOT 1.0 1.9*   Hematology Recent Labs  Lab 12/28/17 1753 12/29/17 0440  WBC 6.8 6.1  RBC 4.19* 4.03*  HGB 14.0 13.4  HCT 38.9* 37.5*  MCV 92.8 93.1  MCH 33.4 33.3  MCHC 36.0 35.7  RDW 13.9 14.0  PLT 242 215   Cardiac Enzymes Recent Labs  Lab 12/29/17 0440 12/29/17 0617  TROPONINI <0.03 <0.03    Recent Labs  Lab 12/28/17 1754 12/28/17 2033  TROPIPOC 0.00 0.00    BNPNo results for input(s): BNP, PROBNP in the last 168 hours.  DDimer No results for input(s): DDIMER in the last 168 hours.  Radiology/Studies:  Dg Chest 2 View  Result Date: 12/28/2017 CLINICAL DATA:  Shortness of breath. EXAM: CHEST - 2 VIEW COMPARISON:  None. FINDINGS: Mild cardiomegaly. Normal pulmonary vascularity. Mild patchy opacity in the right lower lobe. No focal consolidation, pleural effusion, or pneumothorax. No acute osseous abnormality. IMPRESSION: 1. Mild patchy opacities in the right lower lobe could reflect atelectasis or developing infiltrate. Electronically Signed   By: Obie DredgeWilliam T Derry M.D.   On: 12/28/2017 17:44   Ct Head Wo Contrast  Result Date: 12/28/2017 CLINICAL DATA:  Headache with elevated blood pressure and hiccups. EXAM: CT HEAD WITHOUT  CONTRAST TECHNIQUE: Contiguous axial images were obtained from the base of the skull through the vertex without intravenous contrast. COMPARISON:  None. FINDINGS: Brain: The brainstem, cerebellum, cerebral peduncles, thalami, basal ganglia, basilar cisterns, and ventricular system appear within normal limits. No intracranial hemorrhage, mass lesion, or acute CVA. Vascular: Unremarkable Skull: Unremarkable Sinuses/Orbits: Suspected old left medial orbital wall fracture. Mild chronic ethmoid sinusitis. Other: No supplemental non-categorized findings. IMPRESSION: 1. No significant intracranial abnormality to account for the patient's headache. 2. Mild chronic ethmoid sinusitis. Electronically Signed   By: Gaylyn RongWalter  Liebkemann M.D.   On: 12/28/2017 19:53   Ct Angio Chest/abd/pel For Dissection W And/or Wo Contrast  Result Date: 12/28/2017 CLINICAL DATA:  Acute 10/10 chest and abdominal pain. Shortness of breath. Hiccuping. Hypertensive. EXAM: CT ANGIOGRAPHY CHEST, ABDOMEN AND PELVIS TECHNIQUE: Multidetector CT imaging through the chest, abdomen and pelvis was performed using the standard protocol during bolus administration of intravenous contrast. Multiplanar reconstructed images and MIPs were obtained and reviewed to evaluate the vascular anatomy. CONTRAST:  100mL ISOVUE-370 IOPAMIDOL (ISOVUE-370) INJECTION 76% COMPARISON:  None. FINDINGS: CTA CHEST FINDINGS CARDIOVASCULAR: Thoracic aorta is normal course and caliber. Two vessel arch is a normal variant. No intrinsic density on noncontrast CT. Homogeneous contrast opacification of thoracic aorta without dissection, aneurysm, luminal irregularity, periaortic fluid collections, or contrast extravasation. Heart size is mildly enlarged. No pericardial effusion. MEDIASTINUM/NODES: No mediastinal mass or lymphadenopathy by CT size criteria. LUNGS/PLEURA: Tracheobronchial tree is patent, no pneumothorax. No pleural effusions, focal consolidations, pulmonary nodules or  masses. Minimal dependent atelectasis. MUSCULOSKELETAL: Non-suspicious.  Review of the MIP images confirms the above findings. CTA ABDOMEN AND PELVIS FINDINGS VASCULAR Aorta: Abdominal aorta is normal course and caliber. Homogeneous contrast opacification of aortoiliac vessels without dissection, aneurysm, luminal irregularity, periaortic fluid collections, or contrast extravasation. Mild calcific atherosclerosis. Celiac: Patent. SMA: Patent. Renals: Patent. IMA: Patent. Inflow: Negative. Veins: Negative, not tailored for evaluation. Review of the MIP images confirms the above findings. NON-VASCULAR HEPATOBILIARY: Liver and gallbladder are normal. PANCREAS: Normal. SPLEEN: Normal. ADRENALS/URINARY TRACT: Kidneys are orthotopic, demonstrating symmetric enhancement. No nephrolithiasis, hydronephrosis or solid renal masses. The unopacified ureters are normal in course and caliber. Urinary bladder is adequately distended and unremarkable. Normal adrenal glands. STOMACH/BOWEL: Very small hiatal hernia. The stomach, small and large bowel are normal in course and caliber without inflammatory changes. Mild sigmoid colonic diverticulosis. Normal appendix. VASCULAR/LYMPHATIC: No lymphadenopathy by CT size criteria. REPRODUCTIVE: Normal. OTHER: No intraperitoneal free fluid or free air. Small fat containing LEFT inguinal hernia. MUSCULOSKELETAL: Nonacute. Dependent subcutaneous edema. From severe L5-S1 disc height loss with vacuum disc consistent with degenerative discs, moderate at L3-4 and L4-5. Severe L5-S1 moderate to severe L4-5 neural foraminal narrowing. Osteopenia. Review of the MIP images confirms the above findings. IMPRESSION: CTA CHEST: 1. No acute vascular process. 2. Mild cardiomegaly.  No acute pulmonary process. CTA ABDOMEN AND PELVIS: 1. No acute vascular process or acute intra-abdominal/pelvic disease. 2. Osteopenia. Aortic Atherosclerosis (ICD10-I70.0). Electronically Signed   By: Awilda Metro M.D.   On:  12/28/2017 19:58    Assessment and Plan:   1. Chest pain with neg MI, + LVH on EKG no old to compare - no pain today - pain may be GERD, and due to hiccups.  Once hiccups stopped the pain resolved- is on ASA 325 no further ischemic work up, discussed decreased ETOH but importance of fluids when in heat with work -- ok to eat.  Awaiting echo if normal though suspect LVH - follow up cardiology PRN. 2. Uncontrolled HTN now improved, was on no meds as outpt. Now 155/102.  Is now on coreg 6.25 mg BID, cozaar 50 mg daily  Not yet rec'd ARB.  He will need generic meds and needs PCP    For questions or updates, please contact [facility] Please consult [email or web address] for contact info under Cardiology/STEMI.   Signed, Nada Boozer, NP  12/29/2017 12:12 PM   Pt seen and examined   I agree with findings as noted by L INgold above  Pt is a 57 yo who presents for evaluation of CP and HTN I have interviewed pt with help of interpreter.  Still difficult  Pt claims yesterday had hiccups   That is when CP began    Did not have prior    Today, woke up  Pain gone   Was not severe. Pt does say that his blood pressure has been up/down       Currently without complaints ON exam, pt comfortable Neck:   JVP normal   LUngs are CTA    Cardiac RRR   No S3    No murmurs    Chest is nontender    Ext are without edema  He has r/o for MI    EKG shows SR with LVH    Echo pending   Impression CP   Atypical   Does not appear to be ischemic   I would follow clinically   I would not sched any workup besides echo for now  In regards to BP he was on no meds  Coreg and Cozaar ordered   Will follow response, titrating as needed.   Again, echo pending    Dietrich Pates

## 2017-12-29 NOTE — Significant Event (Signed)
[facility] to consult list for AM/review case.  57 YO spanish speaking male with acute onset of chest pain radiating to the back, with HTN.  CTA dissection negative.  Troponin negative x2.  ECG sinus with LVH (No LBBB).  [facility] ED plan to admit to medicine.  Echo ordered. Agree with assessment of HTN urgency. We are happy to see him in the AM as requested or earlier as needed. Please call with questions. Cleta AlbertsGreg Means, MD Cardiology

## 2017-12-29 NOTE — ED Notes (Signed)
ED TO INPATIENT HANDOFF REPORT  Name/Age/Gender Christopher Boyd 57 y.o. male  Code Status    Code Status Orders  (From admission, onward)        Start     Ordered   12/29/17 0011  Full code  Continuous     12/29/17 0014    Code Status History    This patient has a current code status but no historical code status.      Home/SNF/Other Home  Chief Complaint chest pain   Level of Care/Admitting Diagnosis ED Disposition    ED Disposition Condition Comment   Admit  Hospital Area: [facility] [[id number]]  Level of Care: Telemetry [5]  Admit to tele based on following criteria: Monitor for Ischemic changes  Diagnosis: Chest pain [325498]  Admitting Physician: Jani Gravel [[id number]]  Attending Physician: Jani Gravel [[id number]]  PT Class (Do Not Modify): Observation [104]  PT Acc Code (Do Not Modify): Observation [10022]       Medical History Past Medical History:  Diagnosis Date  . GERD (gastroesophageal reflux disease)   . Hypertension     Allergies No Known Allergies  IV Location/Drains/Wounds Patient Lines/Drains/Airways Status   Active Line/Drains/Airways    Name:   Placement date:   Placement time:   Site:   Days:   Peripheral IV 12/28/17 Right Antecubital   12/28/17    1752    Antecubital   1          Labs/Imaging Results for orders placed or performed during the hospital encounter of 12/28/17 (from the past 48 hour(s))  CBC with Differential     Status: Abnormal   Collection Time: 12/28/17  5:53 PM  Result Value Ref Range   WBC 6.8 4.0 - 10.5 K/uL   RBC 4.19 (L) 4.22 - 5.81 MIL/uL   Hemoglobin 14.0 13.0 - 17.0 g/dL   HCT 38.9 (L) 39.0 - 52.0 %   MCV 92.8 78.0 - 100.0 fL   MCH 33.4 26.0 - 34.0 pg   MCHC 36.0 30.0 - 36.0 g/dL   RDW 13.9 11.5 - 15.5 %   Platelets 242 150 - 400 K/uL   Neutrophils Relative % 79 %   Neutro Abs 5.4 1.7 - 7.7 K/uL   Lymphocytes Relative 13 %   Lymphs Abs 0.9 0.7 - 4.0 K/uL   Monocytes Relative 7 %    Monocytes Absolute 0.5 0.1 - 1.0 K/uL   Eosinophils Relative 0 %   Eosinophils Absolute 0.0 0.0 - 0.7 K/uL   Basophils Relative 1 %   Basophils Absolute 0.1 0.0 - 0.1 K/uL    Comment: Performed at [facility], [place] [address], [place], [place] [address]  Comprehensive metabolic panel     Status: Abnormal   Collection Time: 12/28/17  5:53 PM  Result Value Ref Range   Sodium 136 135 - 145 mmol/L   Potassium 3.5 3.5 - 5.1 mmol/L   Chloride 102 98 - 111 mmol/L    Comment: Please note change in reference range.   CO2 23 22 - 32 mmol/L   Glucose, Bld 115 (H) 70 - 99 mg/dL    Comment: Please note change in reference range.   BUN 6 6 - 20 mg/dL    Comment: Please note change in reference range.   Creatinine, Ser 0.71 0.61 - 1.24 mg/dL   Calcium 8.7 (L) 8.9 - 10.3 mg/dL   Total Protein 7.9 6.5 - 8.1 g/dL   Albumin 4.3 3.5 - 5.0  g/dL   AST 47 (H) 15 - 41 U/L   ALT 29 0 - 44 U/L    Comment: Please note change in reference range.   Alkaline Phosphatase 64 38 - 126 U/L   Total Bilirubin 1.0 0.3 - 1.2 mg/dL   GFR calc non Af Amer >60 >60 mL/min   GFR calc Af Amer >60 >60 mL/min    Comment: (NOTE) The eGFR has been calculated using the CKD EPI equation. This calculation has not been validated in all clinical situations. eGFR's persistently <60 mL/min signify possible Chronic Kidney Disease.    Anion gap 11 5 - 15    Comment: Performed at [facility], [place] [address], [place], [place] [address]  Lipase, blood     Status: None   Collection Time: 12/28/17  5:53 PM  Result Value Ref Range   Lipase 34 11 - 51 U/L    Comment: Performed at [facility], [place] [address], [place], [place] [address]  I-stat troponin, ED     Status: None   Collection Time: 12/28/17  5:54 PM  Result Value Ref Range   Troponin i, poc 0.00 0.00 - 0.08 ng/mL   Comment 3            Comment: Due to the release kinetics of cTnI, a negative result within the  first hours of the onset of symptoms does not rule out myocardial infarction with certainty. If myocardial infarction is still suspected, repeat the test at appropriate intervals.   I-Stat CG4 Lactic Acid, ED     Status: Abnormal   Collection Time: 12/28/17  5:56 PM  Result Value Ref Range   Lactic Acid, Venous 3.43 (HH) 0.5 - 1.9 mmol/L   Comment NOTIFIED PHYSICIAN   Urinalysis, Routine w reflex microscopic     Status: Abnormal   Collection Time: 12/28/17  6:56 PM  Result Value Ref Range   Color, Urine STRAW (A) YELLOW   APPearance CLEAR CLEAR   Specific Gravity, Urine 1.005 1.005 - 1.030   pH 6.0 5.0 - 8.0   Glucose, UA 50 (A) NEGATIVE mg/dL   Hgb urine dipstick NEGATIVE NEGATIVE   Bilirubin Urine NEGATIVE NEGATIVE   Ketones, ur NEGATIVE NEGATIVE mg/dL   Protein, ur NEGATIVE NEGATIVE mg/dL   Nitrite NEGATIVE NEGATIVE   Leukocytes, UA NEGATIVE NEGATIVE    Comment: Performed at [facility], [place] [address], [place], [place] [address]  Rapid urine drug screen (hospital performed)     Status: Abnormal   Collection Time: 12/28/17  6:56 PM  Result Value Ref Range   Opiates POSITIVE (A) NONE DETECTED   Cocaine NONE DETECTED NONE DETECTED   Benzodiazepines NONE DETECTED NONE DETECTED   Amphetamines NONE DETECTED NONE DETECTED   Tetrahydrocannabinol NONE DETECTED NONE DETECTED   Barbiturates (A) NONE DETECTED    Result not available. Reagent lot number recalled by manufacturer.    Comment: Performed at [facility], [place] [address], [place], [place] [address]  I-Stat CG4 Lactic Acid, ED     Status: None   Collection Time: 12/28/17  8:02 PM  Result Value Ref Range   Lactic Acid, Venous 1.61 0.5 - 1.9 mmol/L  I-stat troponin, ED     Status: None   Collection Time: 12/28/17  8:33 PM  Result Value Ref Range   Troponin i, poc 0.00 0.00 - 0.08 ng/mL   Comment 3            Comment:  Due to the release kinetics of cTnI, a negative result within the  first hours of the onset of symptoms does not rule out myocardial infarction with certainty. If myocardial infarction is still suspected, repeat the test at appropriate intervals.    Dg Chest 2 View  Result Date: 12/28/2017 CLINICAL DATA:  Shortness of breath. EXAM: CHEST - 2 VIEW COMPARISON:  None. FINDINGS: Mild cardiomegaly. Normal pulmonary vascularity. Mild patchy opacity in the right lower lobe. No focal consolidation, pleural effusion, or pneumothorax. No acute osseous abnormality. IMPRESSION: 1. Mild patchy opacities in the right lower lobe could reflect atelectasis or developing infiltrate. Electronically Signed   By: Titus Dubin M.D.   On: 12/28/2017 17:44   Ct Head Wo Contrast  Result Date: 12/28/2017 CLINICAL DATA:  Headache with elevated blood pressure and hiccups. EXAM: CT HEAD WITHOUT CONTRAST TECHNIQUE: Contiguous axial images were obtained from the base of the skull through the vertex without intravenous contrast. COMPARISON:  None. FINDINGS: Brain: The brainstem, cerebellum, cerebral peduncles, thalami, basal ganglia, basilar cisterns, and ventricular system appear within normal limits. No intracranial hemorrhage, mass lesion, or acute CVA. Vascular: Unremarkable Skull: Unremarkable Sinuses/Orbits: Suspected old left medial orbital wall fracture. Mild chronic ethmoid sinusitis. Other: No supplemental non-categorized findings. IMPRESSION: 1. No significant intracranial abnormality to account for the patient's headache. 2. Mild chronic ethmoid sinusitis. Electronically Signed   By: Van Clines M.D.   On: 12/28/2017 19:53   Ct Angio Chest/abd/pel For Dissection W And/or Wo Contrast  Result Date: 12/28/2017 CLINICAL DATA:  Acute 10/10 chest and abdominal pain. Shortness of breath. Hiccuping. Hypertensive. EXAM: CT ANGIOGRAPHY CHEST, ABDOMEN AND PELVIS TECHNIQUE: Multidetector CT imaging through the chest, abdomen and pelvis was performed using the standard protocol during  bolus administration of intravenous contrast. Multiplanar reconstructed images and MIPs were obtained and reviewed to evaluate the vascular anatomy. CONTRAST:  181m ISOVUE-370 IOPAMIDOL (ISOVUE-370) INJECTION 76% COMPARISON:  None. FINDINGS: CTA CHEST FINDINGS CARDIOVASCULAR: Thoracic aorta is normal course and caliber. Two vessel arch is a normal variant. No intrinsic density on noncontrast CT. Homogeneous contrast opacification of thoracic aorta without dissection, aneurysm, luminal irregularity, periaortic fluid collections, or contrast extravasation. Heart size is mildly enlarged. No pericardial effusion. MEDIASTINUM/NODES: No mediastinal mass or lymphadenopathy by CT size criteria. LUNGS/PLEURA: Tracheobronchial tree is patent, no pneumothorax. No pleural effusions, focal consolidations, pulmonary nodules or masses. Minimal dependent atelectasis. MUSCULOSKELETAL: Non-suspicious. Review of the MIP images confirms the above findings. CTA ABDOMEN AND PELVIS FINDINGS VASCULAR Aorta: Abdominal aorta is normal course and caliber. Homogeneous contrast opacification of aortoiliac vessels without dissection, aneurysm, luminal irregularity, periaortic fluid collections, or contrast extravasation. Mild calcific atherosclerosis. Celiac: Patent. SMA: Patent. Renals: Patent. IMA: Patent. Inflow: Negative. Veins: Negative, not tailored for evaluation. Review of the MIP images confirms the above findings. NON-VASCULAR HEPATOBILIARY: Liver and gallbladder are normal. PANCREAS: Normal. SPLEEN: Normal. ADRENALS/URINARY TRACT: Kidneys are orthotopic, demonstrating symmetric enhancement. No nephrolithiasis, hydronephrosis or solid renal masses. The unopacified ureters are normal in course and caliber. Urinary bladder is adequately distended and unremarkable. Normal adrenal glands. STOMACH/BOWEL: Very small hiatal hernia. The stomach, small and large bowel are normal in course and caliber without inflammatory changes. Mild sigmoid  colonic diverticulosis. Normal appendix. VASCULAR/LYMPHATIC: No lymphadenopathy by CT size criteria. REPRODUCTIVE: Normal. OTHER: No intraperitoneal free fluid or free air. Small fat containing LEFT inguinal hernia. MUSCULOSKELETAL: Nonacute. Dependent subcutaneous edema. From severe L5-S1 disc height loss with vacuum disc consistent with degenerative discs, moderate at L3-4 and L4-5. Severe L5-S1 moderate to  severe L4-5 neural foraminal narrowing. Osteopenia. Review of the MIP images confirms the above findings. IMPRESSION: CTA CHEST: 1. No acute vascular process. 2. Mild cardiomegaly.  No acute pulmonary process. CTA ABDOMEN AND PELVIS: 1. No acute vascular process or acute intra-abdominal/pelvic disease. 2. Osteopenia. Aortic Atherosclerosis (ICD10-I70.0). Electronically Signed   By: Elon Alas M.D.   On: 12/28/2017 19:58    Pending Labs Unresulted Labs (From admission, onward)   Start     Ordered   01/05/18 0500  Creatinine, serum  (enoxaparin (LOVENOX)    CrCl >/= 30 ml/min)  Weekly,   R    Comments:  while on enoxaparin therapy    12/29/17 0014   12/29/17 0500  Comprehensive metabolic panel  Tomorrow morning,   R     12/29/17 0014   12/29/17 0500  CBC  Tomorrow morning,   R     12/29/17 0014   12/29/17 0500  Hemoglobin A1c  Tomorrow morning,   R     12/29/17 0014   12/29/17 0500  Lipid panel  Tomorrow morning,   R     12/29/17 0014   12/29/17 0500  Hepatitis panel, acute  Tomorrow morning,   R     12/29/17 0014   12/29/17 0013  Troponin I  Now then every 6 hours,   R     12/29/17 0014   12/29/17 0008  HIV antibody (Routine Testing)  Once,   R     12/29/17 0014      Vitals/Pain Today's Vitals   12/28/17 1845 12/28/17 2102 12/28/17 2104 12/28/17 2321  BP: (!) 167/92  (!) 185/105 (!) 177/99  Pulse: 74 77 77 68  Resp: 17  18 (!) 24  Temp:      TempSrc:      SpO2: 97%  100% 100%  PainSc:        Isolation Precautions No active isolations  Medications Medications   iopamidol (ISOVUE-370) 76 % injection (has no administration in time range)  enoxaparin (LOVENOX) injection 40 mg (has no administration in time range)  sodium chloride flush (NS) 0.9 % injection 3 mL (has no administration in time range)  sodium chloride flush (NS) 0.9 % injection 3 mL (has no administration in time range)  0.9 %  sodium chloride infusion (has no administration in time range)  acetaminophen (TYLENOL) tablet 650 mg (has no administration in time range)    Or  acetaminophen (TYLENOL) suppository 650 mg (has no administration in time range)  nitroGLYCERIN (NITROGLYN) 2 % ointment 1 inch (has no administration in time range)  atorvastatin (LIPITOR) tablet 80 mg (has no administration in time range)  losartan (COZAAR) tablet 25 mg (has no administration in time range)  hydrALAZINE (APRESOLINE) injection 10 mg (has no administration in time range)  carvedilol (COREG) tablet 3.125 mg (has no administration in time range)  aspirin EC tablet 325 mg (has no administration in time range)  morphine 4 MG/ML injection 4 mg (4 mg Intravenous Given 12/28/17 1757)  ondansetron (ZOFRAN) injection 4 mg (4 mg Intravenous Given 12/28/17 1756)  labetalol (NORMODYNE,TRANDATE) injection 10 mg (10 mg Intravenous Given 12/28/17 1758)  iopamidol (ISOVUE-370) 76 % injection 100 mL (100 mLs Intravenous Contrast Given 12/28/17 1927)  labetalol (NORMODYNE,TRANDATE) injection 10 mg (10 mg Intravenous Given 12/28/17 2353)    Mobility walks

## 2017-12-29 NOTE — ED Notes (Signed)
Cardiologist at bedside, translator ipad at bedside in use

## 2017-12-29 NOTE — ED Notes (Signed)
ED TO INPATIENT HANDOFF REPORT  Name/Age/Gender Christopher Boyd 57 y.o. male  Code Status    Code Status Orders  (From admission, onward)        Start     Ordered   12/29/17 0011  Full code  Continuous     12/29/17 0014    Code Status History    This patient has a current code status but no historical code status.      Home/SNF/Other Home  Chief Complaint chest pain   Level of Care/Admitting Diagnosis ED Disposition    ED Disposition Condition Comment   Admit  Hospital Area: [facility] [[id number]]  Level of Care: Telemetry [5]  Admit to tele based on following criteria: Monitor for Ischemic changes  Diagnosis: Chest pain [962836]  Admitting Physician: Jani Gravel [[id number]]  Attending Physician: Jani Gravel [[id number]]  PT Class (Do Not Modify): Observation [104]  PT Acc Code (Do Not Modify): Observation [10022]       Medical History Past Medical History:  Diagnosis Date  . GERD (gastroesophageal reflux disease)   . Hypertension     Allergies No Known Allergies  IV Location/Drains/Wounds Patient Lines/Drains/Airways Status   Active Line/Drains/Airways    Name:   Placement date:   Placement time:   Site:   Days:   Peripheral IV 12/28/17 Right Antecubital   12/28/17    1752    Antecubital   1          Labs/Imaging Results for orders placed or performed during the hospital encounter of 12/28/17 (from the past 48 hour(s))  CBC with Differential     Status: Abnormal   Collection Time: 12/28/17  5:53 PM  Result Value Ref Range   WBC 6.8 4.0 - 10.5 K/uL   RBC 4.19 (L) 4.22 - 5.81 MIL/uL   Hemoglobin 14.0 13.0 - 17.0 g/dL   HCT 38.9 (L) 39.0 - 52.0 %   MCV 92.8 78.0 - 100.0 fL   MCH 33.4 26.0 - 34.0 pg   MCHC 36.0 30.0 - 36.0 g/dL   RDW 13.9 11.5 - 15.5 %   Platelets 242 150 - 400 K/uL   Neutrophils Relative % 79 %   Neutro Abs 5.4 1.7 - 7.7 K/uL   Lymphocytes Relative 13 %   Lymphs Abs 0.9 0.7 - 4.0 K/uL   Monocytes Relative 7 %    Monocytes Absolute 0.5 0.1 - 1.0 K/uL   Eosinophils Relative 0 %   Eosinophils Absolute 0.0 0.0 - 0.7 K/uL   Basophils Relative 1 %   Basophils Absolute 0.1 0.0 - 0.1 K/uL    Comment: Performed at [facility], [place] [address] [place], [place] [address]  Comprehensive metabolic panel     Status: Abnormal   Collection Time: 12/28/17  5:53 PM  Result Value Ref Range   Sodium 136 135 - 145 mmol/L   Potassium 3.5 3.5 - 5.1 mmol/L   Chloride 102 98 - 111 mmol/L    Comment: Please note change in reference range.   CO2 23 22 - 32 mmol/L   Glucose, Bld 115 (H) 70 - 99 mg/dL    Comment: Please note change in reference range.   BUN 6 6 - 20 mg/dL    Comment: Please note change in reference range.   Creatinine, Ser 0.71 0.61 - 1.24 mg/dL   Calcium 8.7 (L) 8.9 - 10.3 mg/dL   Total Protein 7.9 6.5 - 8.1 g/dL   Albumin 4.3 3.5 - 5.0  g/dL   AST 47 (H) 15 - 41 U/L   ALT 29 0 - 44 U/L    Comment: Please note change in reference range.   Alkaline Phosphatase 64 38 - 126 U/L   Total Bilirubin 1.0 0.3 - 1.2 mg/dL   GFR calc non Af Amer >60 >60 mL/min   GFR calc Af Amer >60 >60 mL/min    Comment: (NOTE) The eGFR has been calculated using the CKD EPI equation. This calculation has not been validated in all clinical situations. eGFR's persistently <60 mL/min signify possible Chronic Kidney Disease.    Anion gap 11 5 - 15    Comment: Performed at [facility], [place] [address] [place], [place] [address]  Lipase, blood     Status: None   Collection Time: 12/28/17  5:53 PM  Result Value Ref Range   Lipase 34 11 - 51 U/L    Comment: Performed at [facility], [place] [address] [place], [place] [address]  I-stat troponin, ED     Status: None   Collection Time: 12/28/17  5:54 PM  Result Value Ref Range   Troponin i, poc 0.00 0.00 - 0.08 ng/mL   Comment 3            Comment: Due to the release kinetics of cTnI, a negative result within the  first hours of the onset of symptoms does not rule out myocardial infarction with certainty. If myocardial infarction is still suspected, repeat the test at appropriate intervals.   I-Stat CG4 Lactic Acid, ED     Status: Abnormal   Collection Time: 12/28/17  5:56 PM  Result Value Ref Range   Lactic Acid, Venous 3.43 (HH) 0.5 - 1.9 mmol/L   Comment NOTIFIED PHYSICIAN   Urinalysis, Routine w reflex microscopic     Status: Abnormal   Collection Time: 12/28/17  6:56 PM  Result Value Ref Range   Color, Urine STRAW (A) YELLOW   APPearance CLEAR CLEAR   Specific Gravity, Urine 1.005 1.005 - 1.030   pH 6.0 5.0 - 8.0   Glucose, UA 50 (A) NEGATIVE mg/dL   Hgb urine dipstick NEGATIVE NEGATIVE   Bilirubin Urine NEGATIVE NEGATIVE   Ketones, ur NEGATIVE NEGATIVE mg/dL   Protein, ur NEGATIVE NEGATIVE mg/dL   Nitrite NEGATIVE NEGATIVE   Leukocytes, UA NEGATIVE NEGATIVE    Comment: Performed at [facility], [place] [address], [place], [place] [address]  Rapid urine drug screen (hospital performed)     Status: Abnormal   Collection Time: 12/28/17  6:56 PM  Result Value Ref Range   Opiates POSITIVE (A) NONE DETECTED   Cocaine NONE DETECTED NONE DETECTED   Benzodiazepines NONE DETECTED NONE DETECTED   Amphetamines NONE DETECTED NONE DETECTED   Tetrahydrocannabinol NONE DETECTED NONE DETECTED   Barbiturates (A) NONE DETECTED    Result not available. Reagent lot number recalled by manufacturer.    Comment: Performed at [facility], [place] [address] [place], [place] [address]  I-Stat CG4 Lactic Acid, ED     Status: None   Collection Time: 12/28/17  8:02 PM  Result Value Ref Range   Lactic Acid, Venous 1.61 0.5 - 1.9 mmol/L  I-stat troponin, ED     Status: None   Collection Time: 12/28/17  8:33 PM  Result Value Ref Range   Troponin i, poc 0.00 0.00 - 0.08 ng/mL   Comment 3            Comment:  Due to the release kinetics of cTnI, a negative result within the  first hours of the onset of symptoms does not rule out myocardial infarction with certainty. If myocardial infarction is still suspected, repeat the test at appropriate intervals.   Comprehensive metabolic panel     Status: Abnormal   Collection Time: 12/29/17  4:40 AM  Result Value Ref Range   Sodium 140 135 - 145 mmol/L   Potassium 3.5 3.5 - 5.1 mmol/L   Chloride 101 98 - 111 mmol/L    Comment: Please note change in reference range.   CO2 29 22 - 32 mmol/L   Glucose, Bld 111 (H) 70 - 99 mg/dL    Comment: Please note change in reference range.   BUN 7 6 - 20 mg/dL    Comment: Please note change in reference range.   Creatinine, Ser 0.72 0.61 - 1.24 mg/dL   Calcium 8.6 (L) 8.9 - 10.3 mg/dL   Total Protein 7.1 6.5 - 8.1 g/dL   Albumin 3.6 3.5 - 5.0 g/dL   AST 40 15 - 41 U/L   ALT 23 0 - 44 U/L    Comment: Please note change in reference range.   Alkaline Phosphatase 64 38 - 126 U/L   Total Bilirubin 1.9 (H) 0.3 - 1.2 mg/dL   GFR calc non Af Amer >60 >60 mL/min   GFR calc Af Amer >60 >60 mL/min    Comment: (NOTE) The eGFR has been calculated using the CKD EPI equation. This calculation has not been validated in all clinical situations. eGFR's persistently <60 mL/min signify possible Chronic Kidney Disease.    Anion gap 10 5 - 15    Comment: Performed at [facility], [place] [address] [place], [place] [address]  CBC     Status: Abnormal   Collection Time: 12/29/17  4:40 AM  Result Value Ref Range   WBC 6.1 4.0 - 10.5 K/uL   RBC 4.03 (L) 4.22 - 5.81 MIL/uL   Hemoglobin 13.4 13.0 - 17.0 g/dL   HCT 37.5 (L) 39.0 - 52.0 %   MCV 93.1 78.0 - 100.0 fL   MCH 33.3 26.0 - 34.0 pg   MCHC 35.7 30.0 - 36.0 g/dL   RDW 14.0 11.5 - 15.5 %   Platelets 215 150 - 400 K/uL    Comment: Performed at [facility], [place] [address] [place], [place] [address]  Lipid panel     Status: None   Collection Time: 12/29/17  4:40 AM  Result Value Ref Range    Cholesterol 186 0 - 200 mg/dL   Triglycerides 142 <150 mg/dL   HDL 78 >40 mg/dL   Total CHOL/HDL Ratio 2.4 RATIO   VLDL 28 0 - 40 mg/dL   LDL Cholesterol 80 0 - 99 mg/dL    Comment:        Total Cholesterol/HDL:CHD Risk Coronary Heart Disease Risk Table                     Men   Women  1/2 Average Risk   3.4   3.3  Average Risk       5.0   4.4  2 X Average Risk   9.6   7.1  3 X Average Risk  23.4   11.0        Use the calculated Patient Ratio above and the CHD Risk Table to determine the patient's CHD Risk.  ATP III CLASSIFICATION (LDL):  <100     mg/dL   Optimal  100-129  mg/dL   Near or Above                    Optimal  130-159  mg/dL   Borderline  160-189  mg/dL   High  >190     mg/dL   Very High Performed at [place] [address], [place], [place] [address]   Troponin I     Status: None   Collection Time: 12/29/17  4:40 AM  Result Value Ref Range   Troponin I <0.03 <0.03 ng/mL    Comment: Performed at [facility], [place] [address], [place], [place] [address]  Troponin I     Status: None   Collection Time: 12/29/17  6:17 AM  Result Value Ref Range   Troponin I <0.03 <0.03 ng/mL    Comment: Performed at [facility], [place] [address] [place], [place] [address]  Troponin I     Status: None   Collection Time: 12/29/17 12:10 PM  Result Value Ref Range   Troponin I <0.03 <0.03 ng/mL    Comment: Performed at [facility], [place] [address] [place], [place] [address]   Dg Chest 2 View  Result Date: 12/28/2017 CLINICAL DATA:  Shortness of breath. EXAM: CHEST - 2 VIEW COMPARISON:  None. FINDINGS: Mild cardiomegaly. Normal pulmonary vascularity. Mild patchy opacity in the right lower lobe. No focal consolidation, pleural effusion, or pneumothorax. No acute osseous abnormality. IMPRESSION: 1. Mild patchy opacities in the right lower lobe could reflect atelectasis or developing infiltrate.  Electronically Signed   By: Titus Dubin M.D.   On: 12/28/2017 17:44   Ct Head Wo Contrast  Result Date: 12/28/2017 CLINICAL DATA:  Headache with elevated blood pressure and hiccups. EXAM: CT HEAD WITHOUT CONTRAST TECHNIQUE: Contiguous axial images were obtained from the base of the skull through the vertex without intravenous contrast. COMPARISON:  None. FINDINGS: Brain: The brainstem, cerebellum, cerebral peduncles, thalami, basal ganglia, basilar cisterns, and ventricular system appear within normal limits. No intracranial hemorrhage, mass lesion, or acute CVA. Vascular: Unremarkable Skull: Unremarkable Sinuses/Orbits: Suspected old left medial orbital wall fracture. Mild chronic ethmoid sinusitis. Other: No supplemental non-categorized findings. IMPRESSION: 1. No significant intracranial abnormality to account for the patient's headache. 2. Mild chronic ethmoid sinusitis. Electronically Signed   By: Van Clines M.D.   On: 12/28/2017 19:53   Ct Angio Chest/abd/pel For Dissection W And/or Wo Contrast  Result Date: 12/28/2017 CLINICAL DATA:  Acute 10/10 chest and abdominal pain. Shortness of breath. Hiccuping. Hypertensive. EXAM: CT ANGIOGRAPHY CHEST, ABDOMEN AND PELVIS TECHNIQUE: Multidetector CT imaging through the chest, abdomen and pelvis was performed using the standard protocol during bolus administration of intravenous contrast. Multiplanar reconstructed images and MIPs were obtained and reviewed to evaluate the vascular anatomy. CONTRAST:  164m ISOVUE-370 IOPAMIDOL (ISOVUE-370) INJECTION 76% COMPARISON:  None. FINDINGS: CTA CHEST FINDINGS CARDIOVASCULAR: Thoracic aorta is normal course and caliber. Two vessel arch is a normal variant. No intrinsic density on noncontrast CT. Homogeneous contrast opacification of thoracic aorta without dissection, aneurysm, luminal irregularity, periaortic fluid collections, or contrast extravasation. Heart size is mildly enlarged. No pericardial effusion.  MEDIASTINUM/NODES: No mediastinal mass or lymphadenopathy by CT size criteria. LUNGS/PLEURA: Tracheobronchial tree is patent, no pneumothorax. No pleural effusions, focal consolidations, pulmonary nodules or masses. Minimal dependent atelectasis. MUSCULOSKELETAL: Non-suspicious. Review of the MIP images confirms the above findings. CTA ABDOMEN  AND PELVIS FINDINGS VASCULAR Aorta: Abdominal aorta is normal course and caliber. Homogeneous contrast opacification of aortoiliac vessels without dissection, aneurysm, luminal irregularity, periaortic fluid collections, or contrast extravasation. Mild calcific atherosclerosis. Celiac: Patent. SMA: Patent. Renals: Patent. IMA: Patent. Inflow: Negative. Veins: Negative, not tailored for evaluation. Review of the MIP images confirms the above findings. NON-VASCULAR HEPATOBILIARY: Liver and gallbladder are normal. PANCREAS: Normal. SPLEEN: Normal. ADRENALS/URINARY TRACT: Kidneys are orthotopic, demonstrating symmetric enhancement. No nephrolithiasis, hydronephrosis or solid renal masses. The unopacified ureters are normal in course and caliber. Urinary bladder is adequately distended and unremarkable. Normal adrenal glands. STOMACH/BOWEL: Very small hiatal hernia. The stomach, small and large bowel are normal in course and caliber without inflammatory changes. Mild sigmoid colonic diverticulosis. Normal appendix. VASCULAR/LYMPHATIC: No lymphadenopathy by CT size criteria. REPRODUCTIVE: Normal. OTHER: No intraperitoneal free fluid or free air. Small fat containing LEFT inguinal hernia. MUSCULOSKELETAL: Nonacute. Dependent subcutaneous edema. From severe L5-S1 disc height loss with vacuum disc consistent with degenerative discs, moderate at L3-4 and L4-5. Severe L5-S1 moderate to severe L4-5 neural foraminal narrowing. Osteopenia. Review of the MIP images confirms the above findings. IMPRESSION: CTA CHEST: 1. No acute vascular process. 2. Mild cardiomegaly.  No acute pulmonary  process. CTA ABDOMEN AND PELVIS: 1. No acute vascular process or acute intra-abdominal/pelvic disease. 2. Osteopenia. Aortic Atherosclerosis (ICD10-I70.0). Electronically Signed   By: Elon Alas M.D.   On: 12/28/2017 19:58    Pending Labs Unresulted Labs (From admission, onward)   Start     Ordered   01/05/18 0500  Creatinine, serum  (enoxaparin (LOVENOX)    CrCl >/= 30 ml/min)  Weekly,   R    Comments:  while on enoxaparin therapy    12/29/17 0014   12/29/17 0500  Hemoglobin A1c  Tomorrow morning,   R     12/29/17 0014   12/29/17 0500  Hepatitis panel, acute  Tomorrow morning,   R     12/29/17 0014   12/29/17 0008  HIV antibody (Routine Testing)  Once,   R     12/29/17 0014      Vitals/Pain Today's Vitals   12/29/17 1030 12/29/17 1200 12/29/17 1300 12/29/17 1319  BP: (!) 161/96 (!) 155/100 (!) 165/99 (!) 165/99  Pulse: 65 60 (!) 57 (!) 57  Resp: '14 17 14   ' Temp:      TempSrc:      SpO2: 100% 99% 99%   PainSc:        Isolation Precautions No active isolations  Medications Medications  iopamidol (ISOVUE-370) 76 % injection (has no administration in time range)  enoxaparin (LOVENOX) injection 40 mg (40 mg Subcutaneous Given 12/29/17 1317)  sodium chloride flush (NS) 0.9 % injection 3 mL (3 mLs Intravenous Given 12/29/17 1234)  sodium chloride flush (NS) 0.9 % injection 3 mL (has no administration in time range)  0.9 %  sodium chloride infusion (has no administration in time range)  acetaminophen (TYLENOL) tablet 650 mg (has no administration in time range)    Or  acetaminophen (TYLENOL) suppository 650 mg (has no administration in time range)  nitroGLYCERIN (NITROGLYN) 2 % ointment 1 inch (1 inch Topical Not Given 12/29/17 1217)  atorvastatin (LIPITOR) tablet 80 mg (has no administration in time range)  hydrALAZINE (APRESOLINE) injection 10 mg (has no administration in time range)  aspirin EC tablet 325 mg (325 mg Oral Given 12/29/17 1223)  LORazepam (ATIVAN) tablet 1  mg (has no administration in time range)    Or  LORazepam (ATIVAN)  injection 1 mg (has no administration in time range)  thiamine (VITAMIN B-1) tablet 100 mg (100 mg Oral Given 12/29/17 1223)    Or  thiamine (B-1) injection 100 mg ( Intravenous See Alternative 12/07/74 2831)  folic acid (FOLVITE) tablet 1 mg (1 mg Oral Given 12/29/17 1225)  multivitamin with minerals tablet 1 tablet (1 tablet Oral Given 12/29/17 1226)  LORazepam (ATIVAN) injection 0-4 mg (0 mg Intravenous Not Given 12/29/17 1318)    Followed by  LORazepam (ATIVAN) injection 0-4 mg (has no administration in time range)  carvedilol (COREG) tablet 6.25 mg (has no administration in time range)  losartan (COZAAR) tablet 50 mg (50 mg Oral Given 12/29/17 1318)  morphine 4 MG/ML injection 4 mg (4 mg Intravenous Given 12/28/17 1757)  ondansetron (ZOFRAN) injection 4 mg (4 mg Intravenous Given 12/28/17 1756)  labetalol (NORMODYNE,TRANDATE) injection 10 mg (10 mg Intravenous Given 12/28/17 1758)  iopamidol (ISOVUE-370) 76 % injection 100 mL (100 mLs Intravenous Contrast Given 12/28/17 1927)  labetalol (NORMODYNE,TRANDATE) injection 10 mg (10 mg Intravenous Given 12/28/17 2353)    Mobility walks

## 2017-12-29 NOTE — Progress Notes (Signed)
*  PRELIMINARY RESULTS* Echocardiogram 2D Echocardiogram has been performed.  Stacey DrainWhite, Layah Skousen J 12/29/2017, 2:24 PM

## 2017-12-30 ENCOUNTER — Encounter (HOSPITAL_COMMUNITY): Payer: Self-pay

## 2017-12-30 LAB — HEPATITIS PANEL, ACUTE
HCV Ab: <0.1 s/co ratio (ref 0.0–0.9)
HEP B C IGM: NEGATIVE
Hep A IgM: NEGATIVE
Hepatitis B Surface Ag: NEGATIVE

## 2017-12-30 LAB — HEMOGLOBIN A1C
HEMOGLOBIN A1C: 4.8 % (ref 4.8–5.6)
MEAN PLASMA GLUCOSE: 91 mg/dL

## 2017-12-30 MED ORDER — FAMOTIDINE 20 MG PO TABS: 20.0000 mg | ORAL_TABLET | Freq: Every day | ORAL | 1 refills | Status: AC

## 2017-12-30 MED ORDER — ATORVASTATIN CALCIUM 80 MG PO TABS: 80.0000 mg | ORAL_TABLET | Freq: Every day | ORAL | 0 refills | Status: DC

## 2017-12-30 MED ORDER — FOLIC ACID 1 MG PO TABS: 1.0000 mg | ORAL_TABLET | Freq: Every day | ORAL | 0 refills | Status: DC

## 2017-12-30 MED ORDER — ASPIRIN EC 81 MG PO TBEC: 81.0000 mg | DELAYED_RELEASE_TABLET | Freq: Every day | ORAL | 0 refills | Status: AC

## 2017-12-30 MED ORDER — CARVEDILOL 6.25 MG PO TABS: 6.2500 mg | ORAL_TABLET | Freq: Two times a day (BID) | ORAL | 0 refills | Status: DC

## 2017-12-30 MED ORDER — THIAMINE HCL 100 MG PO TABS: 100.0000 mg | ORAL_TABLET | Freq: Every day | ORAL | 0 refills | Status: DC

## 2017-12-30 MED ORDER — LOSARTAN POTASSIUM 50 MG PO TABS: 50.0000 mg | ORAL_TABLET | Freq: Every day | ORAL | 0 refills | Status: DC

## 2017-12-30 MED ORDER — ASPIRIN EC 81 MG PO TBEC: 81.0000 mg | DELAYED_RELEASE_TABLET | Freq: Every day | ORAL | 0 refills | Status: DC

## 2017-12-30 NOTE — Progress Notes (Signed)
Progress Note  Patient Name: Christopher Boyd Date of Encounter: 12/30/2017  Primary Cardiologist: Dietrich PatesPaula Kabir Brannock, MD   Subjective   Breathing is good  No Cp   No hiccups    Inpatient Medications    Scheduled Meds: . aspirin EC  325 mg Oral Daily  . atorvastatin  80 mg Oral q1800  . carvedilol  6.25 mg Oral BID WC  . enoxaparin (LOVENOX) injection  40 mg Subcutaneous Q24H  . folic acid  1 mg Oral Daily  . LORazepam  0-4 mg Intravenous Q6H   Followed by  . [START ON 12/31/2017] LORazepam  0-4 mg Intravenous Q12H  . losartan  50 mg Oral Daily  . multivitamin with minerals  1 tablet Oral Daily  . nitroGLYCERIN  1 inch Topical Q8H  . sodium chloride flush  3 mL Intravenous Q12H  . thiamine  100 mg Oral Daily   Or  . thiamine  100 mg Intravenous Daily   Continuous Infusions: . sodium chloride     PRN Meds:   Vital Signs    Vitals:   12/29/17 1530 12/29/17 2312 12/30/17 0459 12/30/17 1230  BP: (!) 157/88 110/73 109/73 125/75  Pulse:  68 67 69  Resp:  14 15 15   Temp:  98 F (36.7 C) 97.8 F (36.6 C) 98.4 F (36.9 C)  TempSrc:  Oral Oral Oral  SpO2:  98% 100% 100%  Weight:   71.5 kg (157 lb 9.6 oz)   Height:        Intake/Output Summary (Last 24 hours) at 12/30/2017 1236 Last data filed at 12/29/2017 1839 Gross per 24 hour  Intake 702 ml  Output -  Net 702 ml   Filed Weights   12/29/17 1420 12/30/17 0459  Weight: 65.1 kg (143 lb 8.3 oz) 71.5 kg (157 lb 9.6 oz)    Telemetry    SR  - Personally Reviewed  ECG      Physical Exam   GEN: No acute distress.   Neck: No JVD Cardiac: RRR, no murmurs, rubs, or gallops.  Respiratory: Clear to auscultation bilaterally. GI: Soft, nontender, non-distended  MS: No edema; No deformity. Neuro:  Nonfocal  Psych: Normal affect   Labs    Chemistry Recent Labs  Lab 12/28/17 1753 12/29/17 0440  NA 136 140  K 3.5 3.5  CL 102 101  CO2 23 29  GLUCOSE 115* 111*  BUN 6 7  CREATININE 0.71 0.72  CALCIUM  8.7* 8.6*  PROT 7.9 7.1  ALBUMIN 4.3 3.6  AST 47* 40  ALT 29 23  ALKPHOS 64 64  BILITOT 1.0 1.9*  GFRNONAA >60 >60  GFRAA >60 >60  ANIONGAP 11 10     Hematology Recent Labs  Lab 12/28/17 1753 12/29/17 0440  WBC 6.8 6.1  RBC 4.19* 4.03*  HGB 14.0 13.4  HCT 38.9* 37.5*  MCV 92.8 93.1  MCH 33.4 33.3  MCHC 36.0 35.7  RDW 13.9 14.0  PLT 242 215    Cardiac Enzymes Recent Labs  Lab 12/29/17 0440 12/29/17 0617 12/29/17 1210  TROPONINI <0.03 <0.03 <0.03    Recent Labs  Lab 12/28/17 1754 12/28/17 2033  TROPIPOC 0.00 0.00     BNPNo results for input(s): BNP, PROBNP in the last 168 hours.   DDimer No results for input(s): DDIMER in the last 168 hours.   Radiology    Dg Chest 2 View  Result Date: 12/28/2017 CLINICAL DATA:  Shortness of breath. EXAM: CHEST - 2 VIEW COMPARISON:  None. FINDINGS: Mild cardiomegaly. Normal pulmonary vascularity. Mild patchy opacity in the right lower lobe. No focal consolidation, pleural effusion, or pneumothorax. No acute osseous abnormality. IMPRESSION: 1. Mild patchy opacities in the right lower lobe could reflect atelectasis or developing infiltrate. Electronically Signed   By: Obie Dredge M.D.   On: 12/28/2017 17:44   Ct Head Wo Contrast  Result Date: 12/28/2017 CLINICAL DATA:  Headache with elevated blood pressure and hiccups. EXAM: CT HEAD WITHOUT CONTRAST TECHNIQUE: Contiguous axial images were obtained from the base of the skull through the vertex without intravenous contrast. COMPARISON:  None. FINDINGS: Brain: The brainstem, cerebellum, cerebral peduncles, thalami, basal ganglia, basilar cisterns, and ventricular system appear within normal limits. No intracranial hemorrhage, mass lesion, or acute CVA. Vascular: Unremarkable Skull: Unremarkable Sinuses/Orbits: Suspected old left medial orbital wall fracture. Mild chronic ethmoid sinusitis. Other: No supplemental non-categorized findings. IMPRESSION: 1. No significant intracranial  abnormality to account for the patient's headache. 2. Mild chronic ethmoid sinusitis. Electronically Signed   By: Gaylyn Rong M.D.   On: 12/28/2017 19:53   Ct Angio Chest/abd/pel For Dissection W And/or Wo Contrast  Result Date: 12/28/2017 CLINICAL DATA:  Acute 10/10 chest and abdominal pain. Shortness of breath. Hiccuping. Hypertensive. EXAM: CT ANGIOGRAPHY CHEST, ABDOMEN AND PELVIS TECHNIQUE: Multidetector CT imaging through the chest, abdomen and pelvis was performed using the standard protocol during bolus administration of intravenous contrast. Multiplanar reconstructed images and MIPs were obtained and reviewed to evaluate the vascular anatomy. CONTRAST:  ISOVUE-370 IOPAMIDOL (ISOVUE-370) INJECTION 76% COMPARISON:  None. FINDINGS: CTA CHEST FINDINGS CARDIOVASCULAR: Thoracic aorta is normal course and caliber. Two vessel arch is a normal variant. No intrinsic density on noncontrast CT. Homogeneous contrast opacification of thoracic aorta without dissection, aneurysm, luminal irregularity, periaortic fluid collections, or contrast extravasation. Heart size is mildly enlarged. No pericardial effusion. MEDIASTINUM/NODES: No mediastinal mass or lymphadenopathy by CT size criteria. LUNGS/PLEURA: Tracheobronchial tree is patent, no pneumothorax. No pleural effusions, focal consolidations, pulmonary nodules or masses. Minimal dependent atelectasis. MUSCULOSKELETAL: Non-suspicious. Review of the MIP images confirms the above findings. CTA ABDOMEN AND PELVIS FINDINGS VASCULAR Aorta: Abdominal aorta is normal course and caliber. Homogeneous contrast opacification of aortoiliac vessels without dissection, aneurysm, luminal irregularity, periaortic fluid collections, or contrast extravasation. Mild calcific atherosclerosis. Celiac: Patent. SMA: Patent. Renals: Patent. IMA: Patent. Inflow: Negative. Veins: Negative, not tailored for evaluation. Review of the MIP images confirms the above findings.  NON-VASCULAR HEPATOBILIARY: Liver and gallbladder are normal. PANCREAS: Normal. SPLEEN: Normal. ADRENALS/URINARY TRACT: Kidneys are orthotopic, demonstrating symmetric enhancement. No nephrolithiasis, hydronephrosis or solid renal masses. The unopacified ureters are normal in course and caliber. Urinary bladder is adequately distended and unremarkable. Normal adrenal glands. STOMACH/BOWEL: Very small hiatal hernia. The stomach, small and large bowel are normal in course and caliber without inflammatory changes. Mild sigmoid colonic diverticulosis. Normal appendix. VASCULAR/LYMPHATIC: No lymphadenopathy by CT size criteria. REPRODUCTIVE: Normal. OTHER: No intraperitoneal free fluid or free air. Small fat containing LEFT inguinal hernia. MUSCULOSKELETAL: Nonacute. Dependent subcutaneous edema. From severe L5-S1 disc height loss with vacuum disc consistent with degenerative discs, moderate at L3-4 and L4-5. Severe L5-S1 moderate to severe L4-5 neural foraminal narrowing. Osteopenia. Review of the MIP images confirms the above findings. IMPRESSION: CTA CHEST: 1. No acute vascular process. 2. Mild cardiomegaly.  No acute pulmonary process. CTA ABDOMEN AND PELVIS: 1. No acute vascular process or acute intra-abdominal/pelvic disease. 2. Osteopenia. Aortic Atherosclerosis (ICD10-I70.0). Electronically Signed   By: Awilda Metro M.D.   On: 12/28/2017 19:58  Cardiac Studies     Patient Profile     58 y.o. male admitted with CP, HTN out of control    Assessment & Plan    1  CP   Resolved   Pt r/o for MI      CP atypical   I do not think cardiac  2   HTN   BP is betteron meds    Need to stress compliance   WIll need outpt f/u in internal medicine    Will sign off    For questions or updates, please contact [facility] Please consult [email or web address] for contact info under Cardiology/STEMI.      Signed, Dietrich Pates, MD  12/30/2017, 12:36 PM

## 2017-12-30 NOTE — Progress Notes (Signed)
Patient with complaints of continued hiccups. NP on call was notified and order received for a one time dose of IM Thorazine. Will continue to monitor patient.

## 2018-01-01 NOTE — Discharge Summary (Signed)
[facility] Discharge Summary   Patient: Christopher Boyd WUJ:[id number]   PCP: Patient, No Pcp Per DOB: 1960-12-15   Date of admission: 12/28/2017   Date of discharge: 12/30/2017   Discharge Diagnoses:  Active Problems:   Chest pain   Admitted From: home Disposition:  home  Recommendations for Outpatient Follow-up:  1. Please follow up with PCP in1 week   Follow-up Information    [facility] Follow up.   Why:  Please call for hospital follow up appointment.  Contact information: [address] [place] [address] [phone]         Diet recommendation: cardiac diet  Activity: The patient is advised to gradually reintroduce usual activities.  Discharge Condition: good  Code Status: full code  History of present illness: As per the H and P dictated on admission, " Christopher Boyd  is a 57 y.o. male, w h/o hypertension felt sob while at work and had hiccups. pt states that has sharp left sided chest pain with hiccups.  Pt is currently cp free.  Pt denies, cough, fever, chills, n/v, diarrhea, brbpr, dysuria, hematuria."  Hospital Course:  Summary of his active problems in the hospital is as following. Chest pain Chest pain resolved at the time of my evaluation. Serial troponins are negative, EKG unremarkable.  Telemetry unremarkable. Echo showed 45 to 50% EF, diffuse hypokinesis.  Grade 1 diastolic dysfunction. Cardiology consult, no further work-up recommended at present. Chest pain atypical. Continue aspirin, Lipitor, Coreg, Pepcid, losartan.  Follow-up with PCP in 1 week.  Hypertensive emergency Most likely due to poor noncompliance Started a new medication, Coreg and losartan Follow-up with PCP.  Dyspnea likely due to hypertension Resolved.  No hypoxia.  Etoh use CIWA Recommend patient to quit using alcohol.  All other chronic medical condition were stable during the hospitalization.    Patient was ambulatory without any assistance. On the day of the discharge the patient's vitals were stable, and no other acute medical condition were reported by patient. the patient was felt safe to be discharge at home with family.  Consultants: cardiology  Procedures: Echocardiogram  DISCHARGE MEDICATION: Allergies as of 12/30/2017   No Known Allergies     Medication List    TAKE these medications   aspirin EC 81 MG tablet Take 1 tablet (81 mg total) by mouth daily.   atorvastatin 80 MG tablet Commonly known as:  LIPITOR Take 1 tablet (80 mg total) by mouth daily at 6 PM.   carvedilol 6.25 MG tablet Commonly known as:  COREG Take 1 tablet (6.25 mg total) by mouth 2 (two) times daily with a meal.   famotidine 20 MG tablet Commonly known as:  PEPCID Take 1 tablet (20 mg total) by mouth daily.   folic acid 1 MG tablet Commonly known as:  FOLVITE Take 1 tablet (1 mg total) by mouth daily.   losartan 50 MG tablet Commonly known as:  COZAAR Take 1 tablet (50 mg total) by mouth daily.   thiamine 100 MG tablet Take 1 tablet (100 mg total) by mouth daily.      No Known Allergies Discharge Instructions    Diet - low sodium heart healthy   Complete by:  As directed    Discharge instructions   Complete by:  As directed    It is important that you read following instructions as well as go over your medication list with RN to help you understand your care after this hospitalization.  Discharge Instructions: Please follow-up with PCP in one week  Please request your primary care physician to go over all Hospital Tests and Procedure/Radiological results at the follow up,  Please get all Hospital records sent to your PCP by signing hospital release before you go home.   Do not take more than prescribed Pain, Sleep and Anxiety Medications. You were cared for by a hospitalist during your hospital stay. If you have any questions about your discharge medications or the care  you received while you were in the hospital after you are discharged, you can call the unit and ask to speak with the hospitalist on call if the hospitalist that took care of you is not available.  Once you are discharged, your primary care physician will handle any further medical issues. Please note that NO REFILLS for any discharge medications will be authorized once you are discharged, as it is imperative that you return to your primary care physician (or establish a relationship with a primary care physician if you do not have one) for your aftercare needs so that they can reassess your need for medications and monitor your lab values. You Must read complete instructions/literature along with all the possible adverse reactions/side effects for all the Medicines you take and that have been prescribed to you. Take any new Medicines after you have completely understood and accept all the possible adverse reactions/side effects. Wear Seat belts while driving. If you have smoked or chewed Tobacco in the last 2 yrs please stop smoking and/or stop any Recreational drug use.   Increase activity slowly   Complete by:  As directed      Discharge Exam: Filed Weights   12/29/17 1420 12/30/17 0459  Weight: 65.1 kg (143 lb 8.3 oz) 71.5 kg (157 lb 9.6 oz)   Vitals:   12/30/17 0459 12/30/17 1230  BP: 109/73 125/75  Pulse: 67 69  Resp: 15 15  Temp: 97.8 F (36.6 C) 98.4 F (36.9 C)  SpO2: 100% 100%   General: Appear in no distress, no Rash; Oral Mucosa moist. Cardiovascular: S1 and S2 Present, no Murmur, no JVD Respiratory: Bilateral Air entry present and Clear to Auscultation, no Crackles, no wheezes Abdomen: Bowel Sound present, Soft and no tenderness Extremities: no Pedal edema, no calf tenderness Neurology: Grossly no focal neuro deficit.  The results of significant diagnostics from this hospitalization (including imaging, microbiology, ancillary and laboratory) are listed below for  reference.    Significant Diagnostic Studies: Dg Chest 2 View  Result Date: 12/28/2017 CLINICAL DATA:  Shortness of breath. EXAM: CHEST - 2 VIEW COMPARISON:  None. FINDINGS: Mild cardiomegaly. Normal pulmonary vascularity. Mild patchy opacity in the right lower lobe. No focal consolidation, pleural effusion, or pneumothorax. No acute osseous abnormality. IMPRESSION: 1. Mild patchy opacities in the right lower lobe could reflect atelectasis or developing infiltrate. Electronically Signed   By: Obie Dredge M.D.   On: 12/28/2017 17:44   Ct Head Wo Contrast  Result Date: 12/28/2017 CLINICAL DATA:  Headache with elevated blood pressure and hiccups. EXAM: CT HEAD WITHOUT CONTRAST TECHNIQUE: Contiguous axial images were obtained from the base of the skull through the vertex without intravenous contrast. COMPARISON:  None. FINDINGS: Brain: The brainstem, cerebellum, cerebral peduncles, thalami, basal ganglia, basilar cisterns, and ventricular system appear within normal limits. No intracranial hemorrhage, mass lesion, or acute CVA. Vascular: Unremarkable Skull: Unremarkable Sinuses/Orbits: Suspected old left medial orbital wall fracture. Mild chronic ethmoid sinusitis. Other: No supplemental non-categorized findings. IMPRESSION: 1. No significant intracranial  abnormality to account for the patient's headache. 2. Mild chronic ethmoid sinusitis. Electronically Signed   By: Gaylyn RongWalter  Liebkemann M.D.   On: 12/28/2017 19:53   Ct Angio Chest/abd/pel For Dissection W And/or Wo Contrast  Result Date: 12/28/2017 CLINICAL DATA:  Acute 10/10 chest and abdominal pain. Shortness of breath. Hiccuping. Hypertensive. EXAM: CT ANGIOGRAPHY CHEST, ABDOMEN AND PELVIS TECHNIQUE: Multidetector CT imaging through the chest, abdomen and pelvis was performed using the standard protocol during bolus administration of intravenous contrast. Multiplanar reconstructed images and MIPs were obtained and reviewed to evaluate the vascular  anatomy. CONTRAST:  100mL ISOVUE-370 IOPAMIDOL (ISOVUE-370) INJECTION 76% COMPARISON:  None. FINDINGS: CTA CHEST FINDINGS CARDIOVASCULAR: Thoracic aorta is normal course and caliber. Two vessel arch is a normal variant. No intrinsic density on noncontrast CT. Homogeneous contrast opacification of thoracic aorta without dissection, aneurysm, luminal irregularity, periaortic fluid collections, or contrast extravasation. Heart size is mildly enlarged. No pericardial effusion. MEDIASTINUM/NODES: No mediastinal mass or lymphadenopathy by CT size criteria. LUNGS/PLEURA: Tracheobronchial tree is patent, no pneumothorax. No pleural effusions, focal consolidations, pulmonary nodules or masses. Minimal dependent atelectasis. MUSCULOSKELETAL: Non-suspicious. Review of the MIP images confirms the above findings. CTA ABDOMEN AND PELVIS FINDINGS VASCULAR Aorta: Abdominal aorta is normal course and caliber. Homogeneous contrast opacification of aortoiliac vessels without dissection, aneurysm, luminal irregularity, periaortic fluid collections, or contrast extravasation. Mild calcific atherosclerosis. Celiac: Patent. SMA: Patent. Renals: Patent. IMA: Patent. Inflow: Negative. Veins: Negative, not tailored for evaluation. Review of the MIP images confirms the above findings. NON-VASCULAR HEPATOBILIARY: Liver and gallbladder are normal. PANCREAS: Normal. SPLEEN: Normal. ADRENALS/URINARY TRACT: Kidneys are orthotopic, demonstrating symmetric enhancement. No nephrolithiasis, hydronephrosis or solid renal masses. The unopacified ureters are normal in course and caliber. Urinary bladder is adequately distended and unremarkable. Normal adrenal glands. STOMACH/BOWEL: Very small hiatal hernia. The stomach, small and large bowel are normal in course and caliber without inflammatory changes. Mild sigmoid colonic diverticulosis. Normal appendix. VASCULAR/LYMPHATIC: No lymphadenopathy by CT size criteria. REPRODUCTIVE: Normal. OTHER: No  intraperitoneal free fluid or free air. Small fat containing LEFT inguinal hernia. MUSCULOSKELETAL: Nonacute. Dependent subcutaneous edema. From severe L5-S1 disc height loss with vacuum disc consistent with degenerative discs, moderate at L3-4 and L4-5. Severe L5-S1 moderate to severe L4-5 neural foraminal narrowing. Osteopenia. Review of the MIP images confirms the above findings. IMPRESSION: CTA CHEST: 1. No acute vascular process. 2. Mild cardiomegaly.  No acute pulmonary process. CTA ABDOMEN AND PELVIS: 1. No acute vascular process or acute intra-abdominal/pelvic disease. 2. Osteopenia. Aortic Atherosclerosis (ICD10-I70.0). Electronically Signed   By: Awilda Metroourtnay  Bloomer M.D.   On: 12/28/2017 19:58    Microbiology: No results found for this or any previous visit (from the past 240 hour(s)).   Labs: CBC: Recent Labs  Lab 12/28/17 1753 12/29/17 0440  WBC 6.8 6.1  NEUTROABS 5.4  --   HGB 14.0 13.4  HCT 38.9* 37.5*  MCV 92.8 93.1  PLT 242 215   Basic Metabolic Panel: Recent Labs  Lab 12/28/17 1753 12/29/17 0440  NA 136 140  K 3.5 3.5  CL 102 101  CO2 23 29  GLUCOSE 115* 111*  BUN 6 7  CREATININE 0.71 0.72  CALCIUM 8.7* 8.6*   Liver Function Tests: Recent Labs  Lab 12/28/17 1753 12/29/17 0440  AST 47* 40  ALT 29 23  ALKPHOS 64 64  BILITOT 1.0 1.9*  PROT 7.9 7.1  ALBUMIN 4.3 3.6   Recent Labs  Lab 12/28/17 1753  LIPASE 34   No results for input(s): AMMONIA  in the last 168 hours. Cardiac Enzymes: Recent Labs  Lab 12/29/17 0440 12/29/17 0617 12/29/17 1210  TROPONINI <0.03 <0.03 <0.03   BNP (last 3 results) No results for input(s): BNP in the last 8760 hours. CBG: No results for input(s): GLUCAP in the last 168 hours. Time spent: 35 minutes  Signed:  Lynden Oxford  [facility] 12/30/2017 , 7:56 AM

## 2018-12-15 ENCOUNTER — Other Ambulatory Visit: Payer: Self-pay

## 2018-12-15 ENCOUNTER — Emergency Department (HOSPITAL_COMMUNITY)
Admission: EM | Admit: 2018-12-15 | Discharge: 2018-12-15 | Disposition: A | Discharge status: Home / Self Care | Payer: Self-pay | Attending: Emergency Medicine | Admitting: Emergency Medicine

## 2018-12-15 ENCOUNTER — Emergency Department (HOSPITAL_COMMUNITY): Payer: Self-pay

## 2018-12-15 DIAGNOSIS — I160 Hypertensive urgency: Secondary | ICD-10-CM

## 2018-12-15 DIAGNOSIS — Z87891 Personal history of nicotine dependence: Secondary | ICD-10-CM | POA: Insufficient documentation

## 2018-12-15 DIAGNOSIS — R066 Hiccough: Secondary | ICD-10-CM | POA: Insufficient documentation

## 2018-12-15 DIAGNOSIS — Z79899 Other long term (current) drug therapy: Secondary | ICD-10-CM | POA: Insufficient documentation

## 2018-12-15 DIAGNOSIS — F1010 Alcohol abuse, uncomplicated: Secondary | ICD-10-CM | POA: Insufficient documentation

## 2018-12-15 DIAGNOSIS — I161 Hypertensive emergency: Secondary | ICD-10-CM | POA: Insufficient documentation

## 2018-12-15 LAB — CBC WITH DIFFERENTIAL/PLATELET
Abs Immature Granulocytes: 0.01 10*3/uL (ref 0.00–0.07)
Basophils Absolute: 0.1 10*3/uL (ref 0.0–0.1)
Basophils Relative: 1 %
Eosinophils Absolute: 0.2 10*3/uL (ref 0.0–0.5)
Eosinophils Relative: 4 %
HCT: 44.7 % (ref 39.0–52.0)
Hemoglobin: 16.2 g/dL (ref 13.0–17.0)
Immature Granulocytes: 0 %
Lymphocytes Relative: 21 %
Lymphs Abs: 1.2 10*3/uL (ref 0.7–4.0)
MCH: 33 pg (ref 26.0–34.0)
MCHC: 36.2 g/dL — ABNORMAL HIGH (ref 30.0–36.0)
MCV: 91 fL (ref 80.0–100.0)
Monocytes Absolute: 0.5 10*3/uL (ref 0.1–1.0)
Monocytes Relative: 10 %
Neutro Abs: 3.5 10*3/uL (ref 1.7–7.7)
Neutrophils Relative %: 64 %
Platelets: 285 10*3/uL (ref 150–400)
RBC: 4.91 MIL/uL (ref 4.22–5.81)
RDW: 13 % (ref 11.5–15.5)
WBC: 5.4 10*3/uL (ref 4.0–10.5)
nRBC: 0 % (ref 0.0–0.2)

## 2018-12-15 LAB — BASIC METABOLIC PANEL
Anion gap: 11 (ref 5–15)
BUN: 5 mg/dL — ABNORMAL LOW (ref 6–20)
CO2: 22 mmol/L (ref 22–32)
Calcium: 9.2 mg/dL (ref 8.9–10.3)
Chloride: 99 mmol/L (ref 98–111)
Creatinine, Ser: 0.81 mg/dL (ref 0.61–1.24)
GFR calc Af Amer: >60 mL/min (ref >60)
GFR calc non Af Amer: >60 mL/min (ref >60)
Glucose, Bld: 116 mg/dL — ABNORMAL HIGH (ref 70–99)
Potassium: 4.5 mmol/L (ref 3.5–5.1)
Sodium: 132 mmol/L — ABNORMAL LOW (ref 135–145)

## 2018-12-15 LAB — TROPONIN I (HIGH SENSITIVITY)
Troponin I (High Sensitivity): 4 ng/L (ref <18)
Troponin I (High Sensitivity): 4 ng/L (ref <18)

## 2018-12-15 MED ORDER — CARVEDILOL 6.25 MG PO TABS: 6.2500 mg | ORAL_TABLET | Freq: Two times a day (BID) | ORAL | 2 refills | Status: AC

## 2018-12-15 MED ORDER — LORAZEPAM 2 MG/ML IJ SOLN
1.0000 mg | Freq: Once | INTRAMUSCULAR | Status: AC
  Administered 2018-12-15: 1 mg via INTRAVENOUS
  Filled 2018-12-15: qty 1

## 2018-12-15 MED ORDER — BACLOFEN 5 MG HALF TABLET
10.0000 mg | ORAL_TABLET | Freq: Three times a day (TID) | ORAL | Status: DC
  Administered 2018-12-15: 10 mg via ORAL
  Filled 2018-12-15: qty 1
  Filled 2018-12-15: qty 2

## 2018-12-15 MED ORDER — FOLIC ACID 1 MG PO TABS: 1.0000 mg | ORAL_TABLET | Freq: Every day | ORAL | 1 refills | Status: AC

## 2018-12-15 MED ORDER — ALUM & MAG HYDROXIDE-SIMETH 200-200-20 MG/5ML PO SUSP
30.0000 mL | Freq: Once | ORAL | Status: AC
  Administered 2018-12-15: 30 mL via ORAL
  Filled 2018-12-15: qty 30

## 2018-12-15 MED ORDER — BACLOFEN 10 MG PO TABS: 10.0000 mg | ORAL_TABLET | Freq: Three times a day (TID) | ORAL | 1 refills | Status: AC

## 2018-12-15 MED ORDER — CARVEDILOL 12.5 MG PO TABS: 6.2500 mg | ORAL_TABLET | Freq: Two times a day (BID) | ORAL | Status: DC

## 2018-12-15 MED ORDER — ATORVASTATIN CALCIUM 80 MG PO TABS: 80.0000 mg | ORAL_TABLET | Freq: Every day | ORAL | 2 refills | Status: AC

## 2018-12-15 MED ORDER — PANTOPRAZOLE SODIUM 40 MG IV SOLR
40.0000 mg | Freq: Once | INTRAVENOUS | Status: AC
  Administered 2018-12-15: 40 mg via INTRAVENOUS
  Filled 2018-12-15: qty 40

## 2018-12-15 MED ORDER — DIPHENHYDRAMINE HCL 50 MG/ML IJ SOLN
25.0000 mg | Freq: Once | INTRAMUSCULAR | Status: AC
  Administered 2018-12-15: 25 mg via INTRAVENOUS
  Filled 2018-12-15: qty 1

## 2018-12-15 MED ORDER — OMEPRAZOLE 20 MG PO CPDR: 20.0000 mg | DELAYED_RELEASE_CAPSULE | Freq: Two times a day (BID) | ORAL | 1 refills | Status: AC

## 2018-12-15 MED ORDER — LOSARTAN POTASSIUM 50 MG PO TABS: 50.0000 mg | ORAL_TABLET | Freq: Every day | ORAL | 2 refills | Status: AC

## 2018-12-15 MED ORDER — THIAMINE HCL 100 MG PO TABS: 100.0000 mg | ORAL_TABLET | Freq: Every day | ORAL | 1 refills | Status: AC

## 2018-12-15 MED ORDER — METOCLOPRAMIDE HCL 5 MG/ML IJ SOLN
10.0000 mg | Freq: Once | INTRAMUSCULAR | Status: AC
  Administered 2018-12-15: 10 mg via INTRAVENOUS
  Filled 2018-12-15: qty 2

## 2018-12-15 MED ORDER — LIDOCAINE VISCOUS HCL 2 % MT SOLN
15.0000 mL | Freq: Once | OROMUCOSAL | Status: AC
  Administered 2018-12-15: 15 mL via ORAL
  Filled 2018-12-15: qty 15

## 2018-12-15 MED ORDER — METOPROLOL TARTRATE 5 MG/5ML IV SOLN
5.0000 mg | Freq: Once | INTRAVENOUS | Status: AC
  Administered 2018-12-15: 5 mg via INTRAVENOUS
  Filled 2018-12-15: qty 5

## 2018-12-15 NOTE — ED Notes (Signed)
Went over discharge and all prescriptions with patient using a spanish interpretor.All questions were answers and pt acknowledges of importance of follow up and continuing these medications.

## 2018-12-15 NOTE — ED Provider Notes (Signed)
[facility] DEPART[facility] Provider Note   CSN: [id number] Arrival date & time: 12/15/18  0534    History   Chief Complaint Chief Complaint  Patient presents with   Hiccups   Hypertension    HPI Christopher Boyd is a 58 y.o. male.     HPI Patient reports he is having bad hiccups.  He is started day before yesterday.  He had a long.  From midday until nighttime on Sunday.  Monday, yesterday, he was not having them until evening time and then they have been continuous since.  He denies he is having chest pain.  He reports this intense hiccuping sometimes making it feel like he cannot breathe.  Patient denies that he was sick leading up to this.  He denies he has had any cough any sputum production or fever.  Patient was seen in the hospital and admitted for chest pain, hypertension and hiccups almost exactly a year ago.  At that time he was ruled out for cardiac etiology and treatment for hypertension initiated.  Discharge medications included Cozaar, Coreg, atorvastatin, aspirin, Pepcid, folic acid and thiamine.  At this time, the only medication the patient has is a Cozaar.  The patient does report he drinks about 12 beers per day. Past Medical History:  Diagnosis Date   GERD (gastroesophageal reflux disease)    Hypertension     Patient Active Problem List   Diagnosis Date Noted   Chest pain 12/29/2017    No past surgical history on file.      Home Medications    Prior to Admission medications   Medication Sig Start Date End Date Taking? Authorizing Provider  aspirin EC 81 MG tablet Take 1 tablet (81 mg total) by mouth daily. 12/31/17   Lavina Hamman, MD  atorvastatin (LIPITOR) 80 MG tablet Take 1 tablet (80 mg total) by mouth daily at 6 PM. 12/15/18   Charlesetta Shanks, MD  baclofen (LIORESAL) 10 MG tablet Take 1 tablet (10 mg total) by mouth 3 (three) times daily. 12/15/18   Charlesetta Shanks, MD  carvedilol (COREG) 6.25 MG tablet Take 1  tablet (6.25 mg total) by mouth 2 (two) times daily with a meal. 12/15/18   Charlesetta Shanks, MD  famotidine (PEPCID) 20 MG tablet Take 1 tablet (20 mg total) by mouth daily. 12/30/17 12/30/18  Lavina Hamman, MD  folic acid (FOLVITE) 1 MG tablet Take 1 tablet (1 mg total) by mouth daily. 12/15/18   Charlesetta Shanks, MD  losartan (COZAAR) 50 MG tablet Take 1 tablet (50 mg total) by mouth daily. 12/15/18   Charlesetta Shanks, MD  omeprazole (PRILOSEC) 20 MG capsule Take 1 capsule (20 mg total) by mouth 2 (two) times daily before a meal. 12/15/18   Charlesetta Shanks, MD  thiamine 100 MG tablet Take 1 tablet (100 mg total) by mouth daily. 12/15/18   Charlesetta Shanks, MD    Family History Family History  Problem Relation Age of Onset   Hyperlipidemia Mother    Heart disease Mother    Healthy Sister     Social History Social History   Tobacco Use   Smoking status: Former Smoker    Packs/day: 0.50    Years: 5.00    Pack years: 2.50    Types: Cigarettes   Smokeless tobacco: Never Used  Substance Use Topics   Alcohol use: Yes    Alcohol/week: 8.0 standard drinks    Types: 8 Cans of beer per week   Drug use:  Not on file     Allergies   Patient has no known allergies.   Review of Systems Review of Systems 10 Systems reviewed and are negative for acute change except as noted in the HPI.  Physical Exam Updated Vital Signs BP (!) 149/103    Pulse 61    Temp 98.6 F (37 C) (Oral)    Resp 13    SpO2 100%   Physical Exam Constitutional:      Comments: Patient is alert and appropriate.  He is having recurrent hiccups and appears uncomfortable with this.  No respiratory distress.  HENT:     Head: Normocephalic and atraumatic.     Right Ear: Ear canal normal.     Left Ear: Ear canal normal.     Nose: Nose normal.     Mouth/Throat:     Mouth: Mucous membranes are moist.     Pharynx: Oropharynx is clear.  Eyes:     Extraocular Movements: Extraocular movements intact.  Cardiovascular:      Rate and Rhythm: Normal rate and regular rhythm.  Pulmonary:     Effort: Pulmonary effort is normal.     Breath sounds: Normal breath sounds.  Abdominal:     General: There is no distension.     Palpations: Abdomen is soft.     Tenderness: There is no abdominal tenderness. There is no guarding.  Musculoskeletal: Normal range of motion.        General: No swelling or tenderness.     Right lower leg: No edema.     Left lower leg: No edema.  Skin:    General: Skin is warm and dry.  Neurological:     General: No focal deficit present.     Mental Status: He is oriented to person, place, and time.     Coordination: Coordination normal.  Psychiatric:        Mood and Affect: Mood normal.      ED Treatments / Results  Labs (all labs ordered are listed, but only abnormal results are displayed) Labs Reviewed  BASIC METABOLIC PANEL - Abnormal; Notable for the following components:      Result Value   Sodium 132 (*)    Glucose, Bld 116 (*)    BUN 5 (*)    All other components within normal limits  CBC WITH DIFFERENTIAL/PLATELET - Abnormal; Notable for the following components:   MCHC 36.2 (*)    All other components within normal limits  TROPONIN I (HIGH SENSITIVITY)  TROPONIN I (HIGH SENSITIVITY)    EKG EKG Interpretation  Date/Time:  Wednesday December 15 2018 05:49:51 EDT Ventricular Rate:  86 PR Interval:  160 QRS Duration: 106 QT Interval:  350 QTC Calculation: 418 R Axis:   -21 Text Interpretation:  Normal sinus rhythm Minimal voltage criteria for LVH, may be normal variant T wave abnormality, consider lateral ischemia Abnormal ECG agree, lateral T wave inversion new compared to older tracing. Confirmed by Arby BarrettePfeiffer, Sukhmani Fetherolf [phone]([id number]) on 12/15/2018 7:34:04 AM   Radiology Dg Abdomen 1 View  Result Date: 12/15/2018 CLINICAL DATA:  Gastric distension EXAM: ABDOMEN - 1 VIEW COMPARISON:  None FINDINGS: No gastric distention identified. Small amount gas at gastric fundus. Stool  throughout colon. Nonobstructive bowel gas pattern. Bones unremarkable. Lung bases clear. IMPRESSION: Nonobstructive bowel gas pattern. No evidence of gastric distention. Electronically Signed   By: Ulyses SouthwardMark  Boles M.D.   On: 12/15/2018 10:57    Procedures Procedures (including critical care time)  Medications Ordered in  ED Medications  metoCLOPramide (REGLAN) injection 10 mg (10 mg Intravenous Given 12/15/18 0755)  diphenhydrAMINE (BENADRYL) injection 25 mg (25 mg Intravenous Given 12/15/18 0753)  pantoprazole (PROTONIX) injection 40 mg (40 mg Intravenous Given 12/15/18 0758)  metoprolol tartrate (LOPRESSOR) injection 5 mg (5 mg Intravenous Given 12/15/18 0750)  LORazepam (ATIVAN) injection 1 mg (1 mg Intravenous Given 12/15/18 0910)  alum & mag hydroxide-simeth (MAALOX/MYLANTA) 200-200-20 MG/5ML suspension 30 mL (30 mLs Oral Given 12/15/18 0911)    And  lidocaine (XYLOCAINE) 2 % viscous mouth solution 15 mL (15 mLs Oral Given 12/15/18 0911)     Initial Impression / Assessment and Plan / ED Course  I have reviewed the triage vital signs and the nursing notes.  Pertinent labs & imaging results that were available during my care of the patient were reviewed by me and considered in my medical decision making (see chart for details).       Patient presents with intractable hiccups.  He has had a similar presentation in the past at which time he has significant hypertension.  At that time hypertensive urgency identified and treatment initiated.  It appears that the patient has only been taking losartan.  He had been prescribed carvedilol as well as Prilosec.  He has had several bouts now of hours worse with hiccups.  His initial blood pressure was elevated but has trended down.  With symptomatic treatment of hiccups and 5 mg of Lopressor.  He does not show any signs of endorgan damage from hypertension.  Mental status is clear.  He is denying chest pain although he is quite uncomfortable with this recurrent  hiccuping.  At this time I have suspicion that this is due to gastritis and alcohol use.  In observing the patient he also does some air ingestion.  He had a CT scan of the chest at his last evaluation and there were no masses or tumors suspicious in appearance.  I did a flat plate abdomen and patient does not have large gastric distention.  Does not sound as though this is been a frequently recurrent problem that would indicate initiating other diagnostic work-up for intractable hiccups at this time.  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  Patient is counseled on alcohol consumption and initiating Prilosec chronically.  He is also counseled on resuming his medications as prescribed at his last hospital discharge.  He is advised that it is very important that he get established with a PCP to help manage these issues.  Final Clinical Impressions(s) / ED Diagnoses   Final diagnoses:  Hiccups  Hypertensive urgency  Alcohol abuse    ED Discharge Orders         Ordered    carvedilol (COREG) 6.25 MG tablet  2 times daily with meals     12/15/18 1112    losartan (COZAAR) 50 MG tablet  Daily     12/15/18 1112    thiamine 100 MG tablet  Daily     12/15/18 1112    folic acid (FOLVITE) 1 MG tablet  Daily     12/15/18 1112    atorvastatin (LIPITOR) 80 MG tablet  Daily-1800     12/15/18 1112    omeprazole (PRILOSEC) 20 MG capsule  2 times daily before meals     12/15/18 1112    baclofen (LIORESAL) 10 MG tablet  3 times daily     12/15/18 1112  Arby BarrettePfeiffer, Jaziel Bennett, MD 12/16/18 [phone]

## 2018-12-15 NOTE — ED Triage Notes (Signed)
Per pt he has been having hiccups since Monday. Stopped on Tuesday and started back again that night and has not stopped. Took Losartan at 2am. This happen last June. No chest pain

## 2018-12-15 NOTE — ED Notes (Signed)
Continued hiccups without acute distress.  Pt calm and alert.  Post medication hiccups remain unchanged at this time.

## 2018-12-15 NOTE — Discharge Instructions (Addendum)
°  Debe buscar un mdico para controlar su presin arterial alta y otros problemas mdicos. Su hipo podra deberse a demasiado alcohol y problemas con el estmago y el esfago inflamados. Debe tomar el medicamento llamado Omeprazol segn lo prescrito dos veces al da. Tambin puede tomar el medicamento llamado baclofeno para ayudar con el hipo. Contine tomando el medicamento para la presin arterial llamado cozaar y reinicie el carvedilol.

## 2020-11-13 IMAGING — DX ABDOMEN - 1 VIEW
1 series · 1 of 1 positions shown · non-contrast
Comparison: None

CLINICAL DATA: Gastric distension

EXAM:
ABDOMEN - 1 VIEW

[abdomen kub]
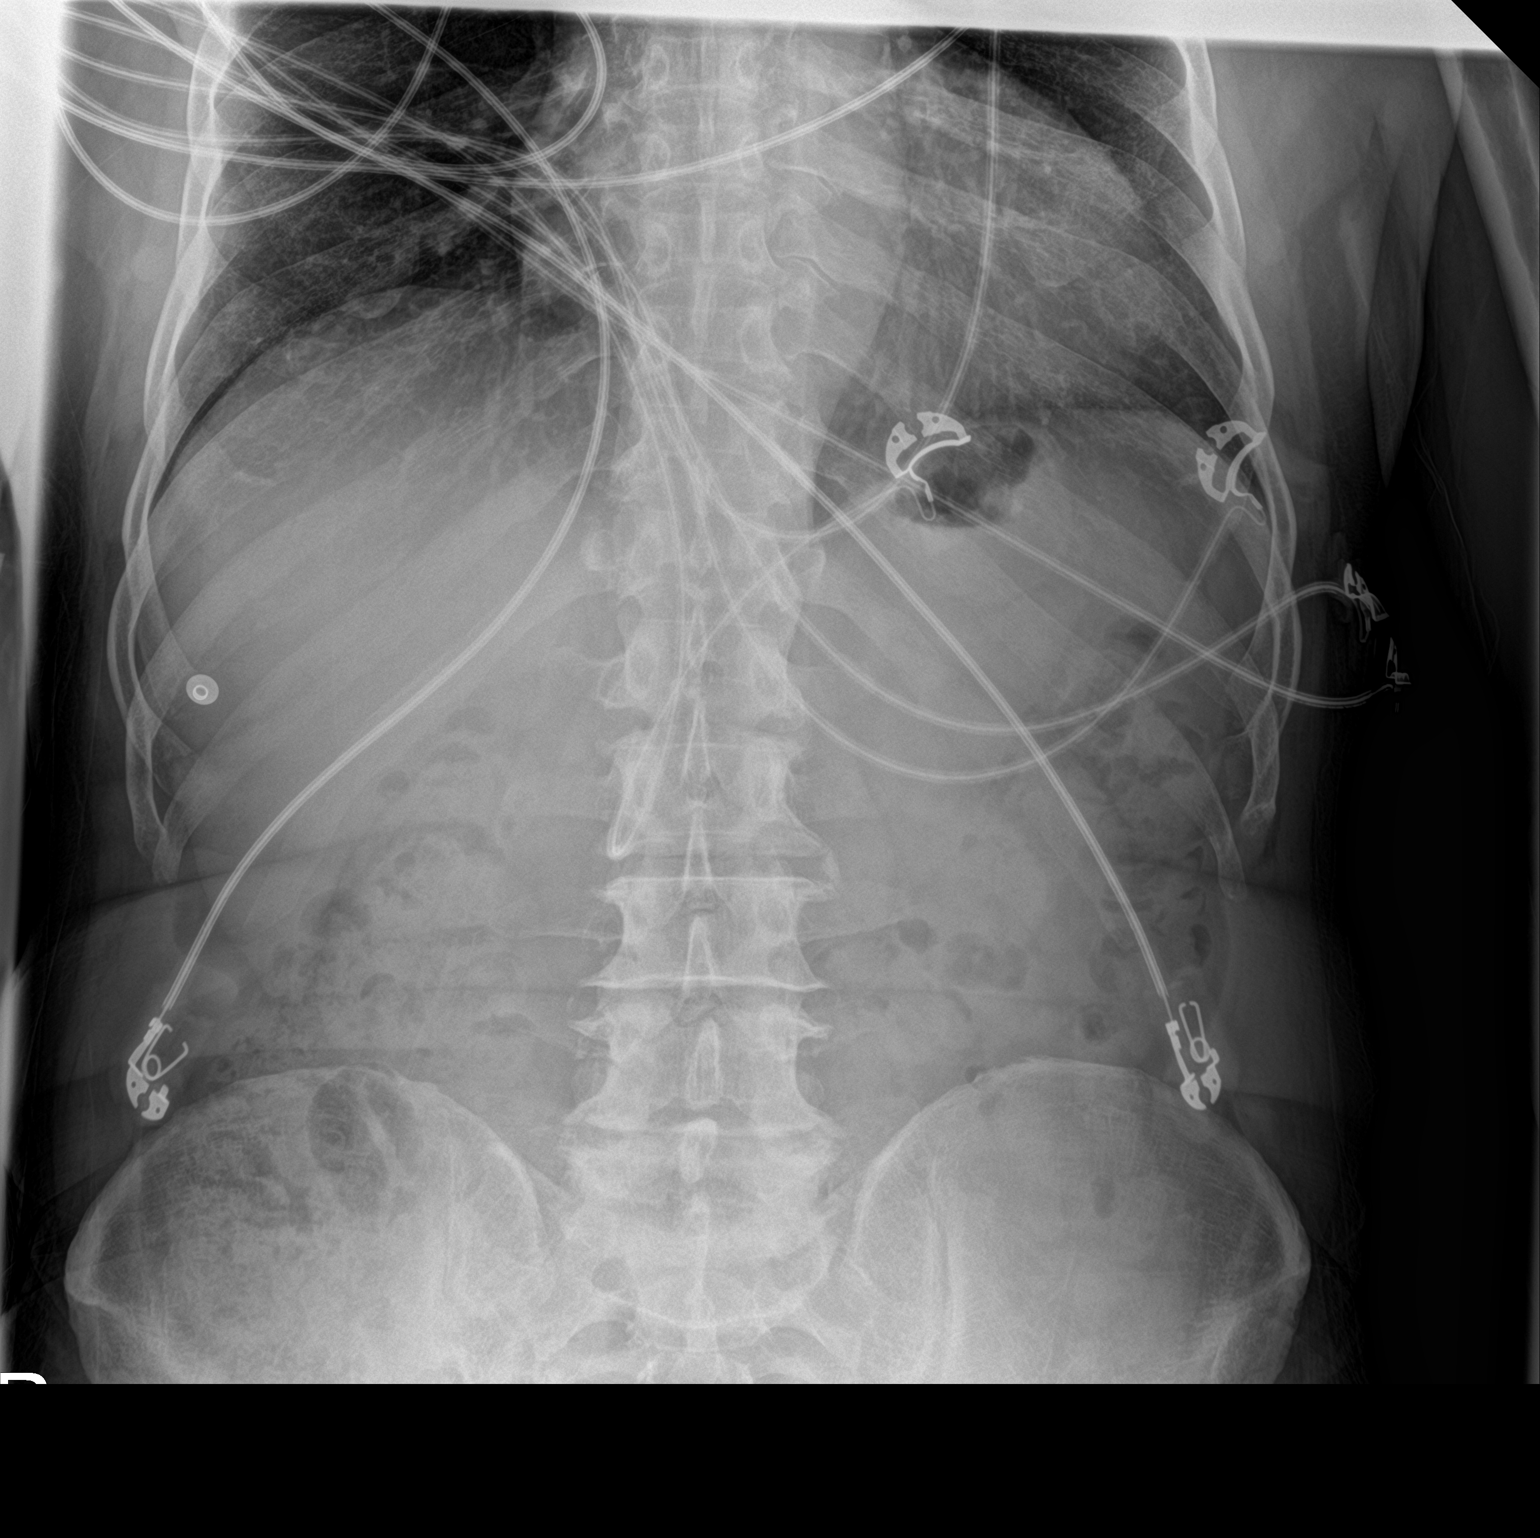

[1 of 1 positions shown; findings below may reference images not displayed]

FINDINGS: No gastric distention identified.

Small amount gas at gastric fundus.

Stool throughout colon.

Nonobstructive bowel gas pattern.

Bones unremarkable.

Lung bases clear.
IMPRESSION: Nonobstructive bowel gas pattern.

No evidence of gastric distention.
